# Patient Record
Sex: Male | Born: 1955
Health system: Southern US, Community
[De-identification: ages and names within clinical notes are randomized; demographics above are authoritative.]

## PROBLEM LIST (undated history)

## (undated) DIAGNOSIS — M19029 Primary osteoarthritis, unspecified elbow: Secondary | ICD-10-CM

## (undated) DIAGNOSIS — M1990 Unspecified osteoarthritis, unspecified site: Secondary | ICD-10-CM

## (undated) DIAGNOSIS — I10 Essential (primary) hypertension: Secondary | ICD-10-CM

## (undated) DIAGNOSIS — N400 Enlarged prostate without lower urinary tract symptoms: Secondary | ICD-10-CM

## (undated) HISTORY — DX: Benign prostatic hyperplasia without lower urinary tract symptoms: N40.0

## (undated) HISTORY — DX: Essential (primary) hypertension: I10

## (undated) HISTORY — DX: Unspecified osteoarthritis, unspecified site: M19.90

## (undated) HISTORY — PX: TONSILLECTOMY: SUR1361

## (undated) HISTORY — DX: Primary osteoarthritis, unspecified elbow: M19.029

---

## 2012-07-28 DIAGNOSIS — IMO0001 Reserved for inherently not codable concepts without codable children: Secondary | ICD-10-CM | POA: Insufficient documentation

## 2013-09-11 DIAGNOSIS — N400 Enlarged prostate without lower urinary tract symptoms: Secondary | ICD-10-CM | POA: Insufficient documentation

## 2015-04-21 DIAGNOSIS — I10 Essential (primary) hypertension: Secondary | ICD-10-CM | POA: Insufficient documentation

## 2017-04-05 DIAGNOSIS — M25561 Pain in right knee: Secondary | ICD-10-CM | POA: Diagnosis not present

## 2017-04-05 DIAGNOSIS — M1711 Unilateral primary osteoarthritis, right knee: Secondary | ICD-10-CM | POA: Diagnosis not present

## 2017-04-05 DIAGNOSIS — L918 Other hypertrophic disorders of the skin: Secondary | ICD-10-CM | POA: Diagnosis not present

## 2017-05-15 DIAGNOSIS — M25561 Pain in right knee: Secondary | ICD-10-CM | POA: Diagnosis not present

## 2017-05-15 DIAGNOSIS — M25461 Effusion, right knee: Secondary | ICD-10-CM | POA: Diagnosis not present

## 2017-05-15 DIAGNOSIS — M1711 Unilateral primary osteoarthritis, right knee: Secondary | ICD-10-CM | POA: Diagnosis not present

## 2017-05-15 DIAGNOSIS — M94261 Chondromalacia, right knee: Secondary | ICD-10-CM | POA: Diagnosis not present

## 2017-06-07 DIAGNOSIS — S83241A Other tear of medial meniscus, current injury, right knee, initial encounter: Secondary | ICD-10-CM | POA: Diagnosis not present

## 2017-06-07 DIAGNOSIS — M25461 Effusion, right knee: Secondary | ICD-10-CM | POA: Diagnosis not present

## 2017-06-07 DIAGNOSIS — M23221 Derangement of posterior horn of medial meniscus due to old tear or injury, right knee: Secondary | ICD-10-CM | POA: Diagnosis not present

## 2017-06-07 DIAGNOSIS — M11261 Other chondrocalcinosis, right knee: Secondary | ICD-10-CM | POA: Diagnosis not present

## 2017-06-07 DIAGNOSIS — M25361 Other instability, right knee: Secondary | ICD-10-CM | POA: Diagnosis not present

## 2017-06-07 DIAGNOSIS — M7121 Synovial cyst of popliteal space [Baker], right knee: Secondary | ICD-10-CM | POA: Diagnosis not present

## 2017-06-12 DIAGNOSIS — M94261 Chondromalacia, right knee: Secondary | ICD-10-CM | POA: Diagnosis not present

## 2017-06-12 DIAGNOSIS — S83241A Other tear of medial meniscus, current injury, right knee, initial encounter: Secondary | ICD-10-CM | POA: Insufficient documentation

## 2017-09-11 DIAGNOSIS — I10 Essential (primary) hypertension: Secondary | ICD-10-CM | POA: Diagnosis not present

## 2017-09-11 DIAGNOSIS — Z1159 Encounter for screening for other viral diseases: Secondary | ICD-10-CM | POA: Diagnosis not present

## 2017-09-12 MED FILL — LISINOPRIL 10 MG TABLET: 10 | 90 days supply | Qty: 90 | Fill #0

## 2017-09-18 DIAGNOSIS — H35711 Central serous chorioretinopathy, right eye: Secondary | ICD-10-CM | POA: Diagnosis not present

## 2017-10-09 DIAGNOSIS — H35722 Serous detachment of retinal pigment epithelium, left eye: Secondary | ICD-10-CM | POA: Diagnosis not present

## 2017-10-09 DIAGNOSIS — H35721 Serous detachment of retinal pigment epithelium, right eye: Secondary | ICD-10-CM | POA: Diagnosis not present

## 2017-10-09 DIAGNOSIS — H35371 Puckering of macula, right eye: Secondary | ICD-10-CM | POA: Diagnosis not present

## 2017-10-09 DIAGNOSIS — H43811 Vitreous degeneration, right eye: Secondary | ICD-10-CM | POA: Diagnosis not present

## 2017-11-05 DIAGNOSIS — Z23 Encounter for immunization: Secondary | ICD-10-CM | POA: Diagnosis not present

## 2017-12-10 MED FILL — LISINOPRIL 10 MG TABLET: 10 | 90 days supply | Qty: 90 | Fill #1

## 2017-12-10 MED FILL — SHIPPING COST: 1 days supply | Qty: 1 | Fill #0

## 2018-03-10 MED FILL — SHIPPING COST: 1 days supply | Qty: 1 | Fill #0

## 2018-03-10 MED FILL — LISINOPRIL 10 MG TABLET: 10 | 90 days supply | Qty: 90 | Fill #2

## 2018-05-21 MED FILL — LISINOPRIL 10 MG TABLET: 10 | 90 days supply | Qty: 90 | Fill #3

## 2018-08-19 DIAGNOSIS — Z8042 Family history of malignant neoplasm of prostate: Secondary | ICD-10-CM | POA: Diagnosis not present

## 2018-08-19 DIAGNOSIS — F5101 Primary insomnia: Secondary | ICD-10-CM | POA: Insufficient documentation

## 2018-08-19 DIAGNOSIS — I10 Essential (primary) hypertension: Secondary | ICD-10-CM | POA: Diagnosis not present

## 2018-08-19 DIAGNOSIS — Z0000 Encounter for general adult medical examination without abnormal findings: Secondary | ICD-10-CM | POA: Diagnosis not present

## 2018-08-19 MED FILL — LISINOPRIL 10 MG TABLET: 10 | 90 days supply | Qty: 90 | Fill #0

## 2018-11-11 MED FILL — LISINOPRIL 10 MG TABS: 10 | 90 days supply | Qty: 90 | Fill #1

## 2018-11-21 DIAGNOSIS — K635 Polyp of colon: Secondary | ICD-10-CM | POA: Diagnosis not present

## 2018-11-21 DIAGNOSIS — Z1211 Encounter for screening for malignant neoplasm of colon: Secondary | ICD-10-CM | POA: Diagnosis not present

## 2018-11-21 DIAGNOSIS — D122 Benign neoplasm of ascending colon: Secondary | ICD-10-CM | POA: Diagnosis not present

## 2018-11-21 LAB — HM COLONOSCOPY

## 2018-12-03 DIAGNOSIS — H43813 Vitreous degeneration, bilateral: Secondary | ICD-10-CM | POA: Diagnosis not present

## 2018-12-03 DIAGNOSIS — H35723 Serous detachment of retinal pigment epithelium, bilateral: Secondary | ICD-10-CM | POA: Diagnosis not present

## 2018-12-03 DIAGNOSIS — H35371 Puckering of macula, right eye: Secondary | ICD-10-CM | POA: Diagnosis not present

## 2019-02-09 MED FILL — LISINOPRIL 10 MG TABS: 10 | 90 days supply | Qty: 90 | Fill #0

## 2019-05-21 MED FILL — LISINOPRIL 10 MG TABS: 10 | 30 days supply | Qty: 30 | Fill #0

## 2019-06-08 DIAGNOSIS — M25521 Pain in right elbow: Secondary | ICD-10-CM | POA: Diagnosis not present

## 2019-06-08 DIAGNOSIS — M25421 Effusion, right elbow: Secondary | ICD-10-CM | POA: Diagnosis not present

## 2019-06-08 DIAGNOSIS — S52044A Nondisplaced fracture of coronoid process of right ulna, initial encounter for closed fracture: Secondary | ICD-10-CM | POA: Diagnosis not present

## 2019-06-09 DIAGNOSIS — S59901A Unspecified injury of right elbow, initial encounter: Secondary | ICD-10-CM | POA: Diagnosis not present

## 2019-07-09 MED FILL — LISINOPRIL 10 MG TABS: 10 | 30 days supply | Qty: 30 | Fill #0

## 2019-07-24 DIAGNOSIS — M25511 Pain in right shoulder: Secondary | ICD-10-CM | POA: Diagnosis not present

## 2019-07-24 DIAGNOSIS — M549 Dorsalgia, unspecified: Secondary | ICD-10-CM | POA: Diagnosis not present

## 2019-08-04 MED FILL — LISINOPRIL 10 MG TABS: 10 | 30 days supply | Qty: 30 | Fill #1

## 2019-08-19 DIAGNOSIS — M93221 Osteochondritis dissecans, right elbow: Secondary | ICD-10-CM | POA: Diagnosis not present

## 2019-08-19 DIAGNOSIS — M25521 Pain in right elbow: Secondary | ICD-10-CM | POA: Diagnosis not present

## 2019-08-25 ENCOUNTER — Other Ambulatory Visit: Payer: Self-pay | Admitting: Student

## 2019-08-25 DIAGNOSIS — M93221 Osteochondritis dissecans, right elbow: Secondary | ICD-10-CM

## 2019-09-01 MED FILL — LISINOPRIL 10 MG TABS: 10 | 30 days supply | Qty: 30 | Fill #0

## 2019-09-15 ENCOUNTER — Other Ambulatory Visit: Payer: Self-pay

## 2019-09-15 ENCOUNTER — Ambulatory Visit
Admission: RE | Admit: 2019-09-15 | Discharge: 2019-09-15 | Disposition: A | Discharge status: Home / Self Care | Payer: 59 | Source: Ambulatory Visit | Attending: Student | Admitting: Student

## 2019-09-15 DIAGNOSIS — M93221 Osteochondritis dissecans, right elbow: Secondary | ICD-10-CM

## 2019-09-15 DIAGNOSIS — M19021 Primary osteoarthritis, right elbow: Secondary | ICD-10-CM | POA: Diagnosis not present

## 2019-09-25 MED FILL — LISINOPRIL 10 MG TABS: 10 | 30 days supply | Qty: 30 | Fill #1

## 2019-10-22 ENCOUNTER — Telehealth: Payer: Self-pay

## 2019-10-22 ENCOUNTER — Encounter: Payer: Self-pay | Admitting: Sports Medicine

## 2019-10-22 ENCOUNTER — Ambulatory Visit (INDEPENDENT_AMBULATORY_CARE_PROVIDER_SITE_OTHER): Payer: 59 | Admitting: Sports Medicine

## 2019-10-22 DIAGNOSIS — M19021 Primary osteoarthritis, right elbow: Secondary | ICD-10-CM | POA: Insufficient documentation

## 2019-10-22 DIAGNOSIS — M6788 Other specified disorders of synovium and tendon, other site: Secondary | ICD-10-CM

## 2019-10-22 MED ORDER — NITROGLYCERIN 0.2 MG/HR TD PT24: MEDICATED_PATCH | TRANSDERMAL | 11 refills | Status: DC

## 2019-10-22 NOTE — Progress Notes (Signed)
    Procedures performed today:    None.  Independent interpretation of notes and tests performed by another provider:   MRI personally reviewed, there is osteoarthritis, mild effusion as well as intra-articular loose body in the olecranon fossa.  Brief History, Exam, Impression, and Recommendations:    Achilles tendinosis of left lower extremity John Tran has had a year of pain in his left Achilles, he has a palpable nodule, all consistent with classic Achilles tendinosis. We described the pathophysiology as well as the treatment plan, we will do heel lifts, he will cross train from running, adding eccentric physical therapy, topical nitroglycerin patches. He understands this can take months to heal. Return to see me in 4 weeks.  Primary osteoarthritis of right elbow John Tran also had a fall back in May, since then he has had moderate elbow pain, he ultimately had an MRI of his elbow performed that showed osteoarthritis as well as an intra-articular loose body in the olecranon fossa. His principal complaint is lack of range of motion, for this reason I do think it is crucial to remove the loose body. Referral to orthopedic surgery, when he returns to me afterwards we can certainly try a steroid injection with ultrasound guidance if needed.    ___________________________________________ Gwen Her. Dianah Field, M.D., ABFM., CAQSM. Primary Care and [place] Instructor of [employer] [facility]

## 2019-10-22 NOTE — Telephone Encounter (Signed)
Here is the clarification needed, just tell them he will need to cut each patch into quarters and then place one quarter patch on the site of the maximum pain daily, leave it on for 24 hours, remove it the next day and place a different quarter patch.  He will do this daily for 4 to 8 weeks.  It is okay to cut the patches.  There will be no abnormal release of the medication.  Please relay this information to the pharmacy.

## 2019-10-22 NOTE — Assessment & Plan Note (Signed)
John Tran has had a year of pain in his left Achilles, he has a palpable nodule, all consistent with classic Achilles tendinosis. We described the pathophysiology as well as the treatment plan, we will do heel lifts, he will cross train from running, adding eccentric physical therapy, topical nitroglycerin patches. He understands this can take months to heal. Return to see me in 4 weeks.

## 2019-10-22 NOTE — Telephone Encounter (Signed)
[facility] requesting clarification and directions for nitroglycerin patches. Please contact the pharmacy at [phone]. May ask to speak with pharmacist Juliann Pulse or Elmo Putt.

## 2019-10-22 NOTE — Assessment & Plan Note (Signed)
John Tran also had a fall back in May, since then he has had moderate elbow pain, he ultimately had an MRI of his elbow performed that showed osteoarthritis as well as an intra-articular loose body in the olecranon fossa. His principal complaint is lack of range of motion, for this reason I do think it is crucial to remove the loose body. Referral to orthopedic surgery, when he returns to me afterwards we can certainly try a steroid injection with ultrasound guidance if needed.

## 2019-10-23 NOTE — Telephone Encounter (Signed)
Called pharmacy, they will not accept this because pharmacist states due to manufacturer it is not OK to cut patch and they will not be able to dispense an RX with that sig

## 2019-10-23 NOTE — Telephone Encounter (Signed)
Opened in error

## 2019-10-24 MED ORDER — NITROGLYCERIN 0.2 MG/HR TD PT24: MEDICATED_PATCH | TRANSDERMAL | 11 refills | Status: DC

## 2019-10-24 NOTE — Telephone Encounter (Signed)
This is ridiculous, I resent the prescription and removed the instructions to cut the patch but please let the patient know he needs to cut it into quarters.  The other option is we can just send it to a different pharmacy where the pharmacist will not question the authority of the physician.

## 2019-10-26 MED ORDER — NITROGLYCERIN 0.2 MG/HR TD PT24: MEDICATED_PATCH | TRANSDERMAL | 11 refills | Status: DC

## 2019-10-26 NOTE — Telephone Encounter (Signed)
The prescription still reads Cut and apply patch to most painful area q24h.

## 2019-10-26 NOTE — Telephone Encounter (Signed)
Well sh*t, let me try again.

## 2019-10-28 ENCOUNTER — Ambulatory Visit: Payer: 59 | Admitting: Family Medicine

## 2019-10-30 ENCOUNTER — Ambulatory Visit: Payer: Self-pay

## 2019-10-30 ENCOUNTER — Ambulatory Visit: Payer: 59 | Admitting: Orthopaedic Surgery

## 2019-10-30 VITALS — Ht 69.5 in | Wt 192.0 lb

## 2019-10-30 DIAGNOSIS — M19021 Primary osteoarthritis, right elbow: Secondary | ICD-10-CM | POA: Diagnosis not present

## 2019-10-30 NOTE — Progress Notes (Signed)
Office Visit Note   Patient: John Tran           Date of Birth: Feb 08, 1955           MRN: [id number] Visit Date: 10/30/2019              Requested by: John Tran, [address] [place],  [place] [id number] PCP: John Baltimore, MD   Assessment & Plan: Visit Diagnoses:  1. Primary osteoarthritis of right elbow     Plan: Impression is right elbow OA.  He does not really complain of any mechanical symptoms but mainly stiffness and some mild pain.  I reviewed the MRI with the patient in detail and we agreed to try cortisone injection using ultrasound guidance today see how this improves his symptoms.  We also briefly discussed surgical treatment of loose body excision.  He will get back in touch with [place] if he is interested in the surgery should this injection failed to provide him with any relief.  Follow-Up Instructions: Return if symptoms worsen or fail to improve.   Orders:  No orders of the defined types were placed in this encounter.  No orders of the defined types were placed in this encounter.     Procedures: No procedures performed   Clinical Data: No additional findings.   Subjective: Chief Complaint  Patient presents with  . Right Elbow - Pain    John Tran is a very pleasant 64 year old gentleman who works in [place] who is a referral from [facility] for right elbow stiffness and OA.  He is right-handed and states that the pain is mild that is worse with activity.  He has noticed that he does not have full range of motion.  Denies any numbness and tingling.  He feels that this has gotten worse after he had a fall while hiking in May.  He has taken some over-the-counter ibuprofen as needed.  Recently had MRI which showed OA and loose bodies in the olecranon fossa.   Review of Systems  Constitutional: Negative.   All other systems reviewed and are negative.    Objective: Vital Signs: Ht 5' 9.5" (1.765 m)   Wt 192 lb  (87.1 kg)   BMI 27.95 kg/m   Physical Exam Vitals and nursing note reviewed.  Constitutional:      Appearance: He is well-developed.  HENT:     Head: Normocephalic and atraumatic.  Eyes:     Pupils: Pupils are equal, round, and reactive to light.  Pulmonary:     Effort: Pulmonary effort is normal.  Abdominal:     Palpations: Abdomen is soft.  Musculoskeletal:        General: Normal range of motion.     Cervical back: Neck supple.  Skin:    General: Skin is warm.  Neurological:     Mental Status: He is alert and oriented to person, place, and time.  Psychiatric:        Behavior: Behavior normal.        Thought Content: Thought content normal.        Judgment: Judgment normal.     Ortho Exam Right elbow shows limited flexion to 90 degrees and extension to about 15 degrees.  Normal pronation supination.  No neurovascular compromise. Specialty Comments:  No specialty comments available.  Imaging: No results found.   PMFS History: Patient Active Problem List   Diagnosis Date Noted  . Primary osteoarthritis of right elbow 10/22/2019  .  Achilles tendinosis of left lower extremity 10/22/2019   Past Medical History:  Diagnosis Date  . Arthritis   . Hypertension     No family history on file.   Social History   Occupational History  . Not on file  Tobacco Use  . Smoking status: Never Smoker  . Smokeless tobacco: Never Used  Substance and Sexual Activity  . Alcohol use: Yes    Alcohol/week: 3.0 standard drinks    Types: 3 Glasses of wine per week  . Drug use: Not on file  . Sexual activity: Not on file

## 2019-10-30 NOTE — Addendum Note (Signed)
Addended by: Robyne Peers on: 10/30/2019 10:06 AM   Modules accepted: Orders

## 2019-10-30 NOTE — Progress Notes (Signed)
Subjective: He is here for ultrasound-guided right elbow injection.  Objective: He has stiffness in the elbow with extension and flexion.  Procedure: Ultrasound-guided right elbow intra-articular injection: After sterile prep with Betadine, injected 3 cc 1% lidocaine without epinephrine and 40 mg methylprednisolone into the posterior joint recess.  He had good relief during the anesthetic phase.

## 2019-11-02 ENCOUNTER — Ambulatory Visit: Payer: 59 | Admitting: Rehabilitative and Restorative Service Providers"

## 2019-11-05 ENCOUNTER — Encounter: Payer: Self-pay | Admitting: Family Medicine

## 2019-11-05 ENCOUNTER — Ambulatory Visit (INDEPENDENT_AMBULATORY_CARE_PROVIDER_SITE_OTHER): Payer: 59 | Admitting: Family Medicine

## 2019-11-05 ENCOUNTER — Ambulatory Visit (INDEPENDENT_AMBULATORY_CARE_PROVIDER_SITE_OTHER): Payer: 59 | Admitting: Rehabilitative and Restorative Service Providers"

## 2019-11-05 ENCOUNTER — Encounter: Payer: Self-pay | Admitting: Rehabilitative and Restorative Service Providers"

## 2019-11-05 ENCOUNTER — Other Ambulatory Visit: Payer: Self-pay

## 2019-11-05 VITALS — BP 148/80 | HR 63 | Temp 98.3°F | Ht 69.29 in | Wt 191.1 lb

## 2019-11-05 DIAGNOSIS — I10 Essential (primary) hypertension: Secondary | ICD-10-CM | POA: Diagnosis not present

## 2019-11-05 DIAGNOSIS — M6281 Muscle weakness (generalized): Secondary | ICD-10-CM | POA: Diagnosis not present

## 2019-11-05 DIAGNOSIS — M25572 Pain in left ankle and joints of left foot: Secondary | ICD-10-CM | POA: Diagnosis not present

## 2019-11-05 DIAGNOSIS — Z23 Encounter for immunization: Secondary | ICD-10-CM | POA: Diagnosis not present

## 2019-11-05 NOTE — Assessment & Plan Note (Signed)
BP is mildly elevated.  He has had white coat HTN previously.  He will bring home cuff in to next appt to compare as readings at home have been well controlled.  He will continue lisinopril at current strength for now.

## 2019-11-05 NOTE — Progress Notes (Signed)
John Tran - 64 y.o. male MRN [id number]  Date of birth: 03-12-1955  Subjective Chief Complaint  Patient presents with  . Establish Care    HPI John Tran is a 64 y.o. male here today for initial visit.  He has a history of HTN and BPH.  He has had some orthopedic issues with his elbow and achilles.  He is currently seeing orthopedics for his elbow.  He is tolerating lisinopril well for management of his HTN.  HE denies symptoms related to HTN including chest pain, shortness of breath, palpitations, headache or vision changes.   ROS:  A comprehensive ROS was completed and negative except as noted per HPI  No Known Allergies  Past Medical History:  Diagnosis Date  . Arthritis   . Hypertension     Past Surgical History:  Procedure Laterality Date  . TONSILLECTOMY      Social History   Socioeconomic History  . Marital status: Married    Spouse name: Not on file  . Number of children: 3  . Years of education: Not on file  . Highest education level: Not on file  Occupational History  . Not on file  Tobacco Use  . Smoking status: Never Smoker  . Smokeless tobacco: Never Used  Vaping Use  . Vaping Use: Never used  Substance and Sexual Activity  . Alcohol use: Yes    Alcohol/week: 4.0 standard drinks    Types: 4 Glasses of wine per week  . Drug use: Never  . Sexual activity: Yes    Partners: Female  Other Topics Concern  . Not on file  Social History Narrative  . Not on file   Social Determinants of Health   Financial Resource Strain:   . Difficulty of Paying Living Expenses: Not on file  Food Insecurity:   . Worried About Charity fundraiser in the Last Year: Not on file  . Ran Out of Food in the Last Year: Not on file  Transportation Needs:   . Lack of Transportation (Medical): Not on file  . Lack of Transportation (Non-Medical): Not on file  Physical Activity:   . Days of Exercise per Week: Not on file  . Minutes of Exercise per Session: Not on file   Stress:   . Feeling of Stress : Not on file  Social Connections:   . Frequency of Communication with Friends and Family: Not on file  . Frequency of Social Gatherings with Friends and Family: Not on file  . Attends Religious Services: Not on file  . Active Member of Clubs or Organizations: Not on file  . Attends Archivist Meetings: Not on file  . Marital Status: Not on file    Family History  Problem Relation Age of Onset  . Hypertension Mother   . Skin cancer Mother   . Prostate cancer Father     Health Maintenance  Topic Date Due  . Hepatitis C Screening  Never done  . COVID-19 Vaccine (1) Never done  . HIV Screening  Never done  . COLONOSCOPY  Never done  . TETANUS/TDAP  09/12/2023  . INFLUENZA VACCINE  Completed     ----------------------------------------------------------------------------------------------------------------------------------------------------------------------------------------------------------------- Physical Exam BP (!) 148/80 (BP Location: Left Arm, Patient Position: Sitting, Cuff Size: Normal)   Pulse 63   Temp 98.3 F (36.8 C) (Oral)   Ht 5' 9.29" (1.76 m)   Wt 191 lb 1.6 oz (86.7 kg)   SpO2 96%   BMI 27.98 kg/m  Physical Exam Constitutional:      Appearance: Normal appearance.  Eyes:     General: No scleral icterus. Cardiovascular:     Rate and Rhythm: Normal rate and regular rhythm.  Pulmonary:     Effort: Pulmonary effort is normal.     Breath sounds: Normal breath sounds.  Skin:    General: Skin is warm and dry.  Neurological:     General: No focal deficit present.     Mental Status: He is alert.  Psychiatric:        Mood and Affect: Mood normal.        Behavior: Behavior normal.     ------------------------------------------------------------------------------------------------------------------------------------------------------------------------------------------------------------------- Assessment and  Plan  Benign essential hypertension BP is mildly elevated.  He has had white coat HTN previously.  He will bring home cuff in to next appt to compare as readings at home have been well controlled.  He will continue lisinopril at current strength for now.     No orders of the defined types were placed in this encounter.   No follow-ups on file.    This visit occurred during the SARS-CoV-2 public health emergency.  Safety protocols were in place, including screening questions prior to the visit, additional usage of staff PPE, and extensive cleaning of exam room while observing appropriate contact time as indicated for disinfecting solutions.

## 2019-11-05 NOTE — Patient Instructions (Signed)
Access Code: [email or web address] URL: [email or web address] Date: 11/05/2019 Prepared by: Rudell Cobb  Exercises Gastroc Stretch on Wall - 2 x daily - 7 x weekly - 1 sets - 3 reps - 30 seconds hold Standing Soleus Stretch - 2 x daily - 7 x weekly - 1 sets - 3 reps - 30 seconds hold Calf Mobilization with Foam Roll - 2 x daily - 7 x weekly - 1 sets - 1 reps - 3 minutes hold Squat with Chair Touch - 2 x daily - 7 x weekly - 1 sets - 10 reps Seated Table Hamstring Stretch - 2 x daily - 7 x weekly - 1 sets - 3 reps - 30 seconds hold

## 2019-11-05 NOTE — Patient Instructions (Addendum)
Great to meet you today! Continue lisinopril at current dose.  See me again around the first of the year for annual exam.

## 2019-11-05 NOTE — Therapy (Signed)
[facility] [address] [place], [place], [address] Phone: [phone]   Fax:  [phone]  Physical Therapy Evaluation  Patient Details  Name: John Tran MRN: [id number] Date of Birth: 07-Jan-1956 Referring Provider (PT): Aundria Mems, MD   Encounter Date: 11/05/2019   PT End of Session - 11/05/19 1529    Visit Number 1    Number of Visits 12    Date for PT Re-Evaluation 11/17/19    Authorization Type UMR/ [facility]ac[facility] Pontes    PT Start Time 1448    PT Stop Time 1528    PT Time Calculation (min) 40 min    Activity Tolerance Patient tolerated treatment well;Patient limited by pain    Behavior During Therapy [facility] for tasks assessed/performed           Past Medical History:  Diagnosis Date   Arthritis    Hypertension     Past Surgical History:  Procedure Laterality Date   TONSILLECTOMY      There were no vitals filed for this visit.    Subjective Assessment - 11/05/19 1447    Subjective The patient reports that he is feeling better with heel lifts in the shoes and also feels like the patches are helping.  Initial onset of pain was a year ago.  He reports he limps when pain is bad.  It can feel stiff in the morning.    How long can you walk comfortably? Walks 2 miles at a time with his wife; is avoiding running    Patient Stated Goals Being able to walk without pain    Currently in Pain? Yes    Pain Score 0-No pain   "I limp when I'm walking"  goes up to moderate level   Pain Location Ankle    Pain Orientation Left    Pain Type Chronic pain    Pain Onset More than a month ago    Pain Frequency Intermittent              [facility] PT Assessment - 11/05/19 1456      Assessment   Medical Diagnosis L achilles tendonosis    Referring Provider (PT) Aundria Mems, MD    Onset Date/Surgical Date 10/22/19   one year onset   Hand Dominance Right    Prior Therapy none      Precautions   Precautions  None      Restrictions   Weight Bearing Restrictions No      Balance Screen   Has the patient fallen in the past 6 months No    Has the patient had a decrease in activity level because of a fear of falling?  No    Is the patient reluctant to leave their home because of a fear of falling?  No      Home Ecologist residence    Living Arrangements Spouse/significant other      Prior Function   Level of Independence Independent    Vocation Full time employment    Vocation Requirements [employer]/ works from home      Observation/Other Assessments   Focus on Therapeutic Outcomes (FOTO)  24% limitation      Sensation   Light Touch Appears Intact      ROM / Strength   AROM / PROM / Strength AROM;Strength      AROM   Overall AROM  Deficits    AROM Assessment Site Ankle    Right/Left  Ankle Right;Left    Right Ankle Dorsiflexion -4    Right Ankle Plantar Flexion 44    Left Ankle Dorsiflexion -12    Left Ankle Plantar Flexion 48    Left Ankle Inversion 19    Left Ankle Eversion 15      Strength   Overall Strength Deficits    Strength Assessment Site Ankle    Right/Left Ankle Left    Left Ankle Dorsiflexion 5/5    Left Ankle Plantar Flexion 3/5   able to lift against gravity 1 rep with pain   Left Ankle Inversion 5/5    Left Ankle Eversion 5/5      Flexibility   Soft Tissue Assessment /Muscle Length yes    Hamstrings bilateral significant hamstring tightness and significant bilateral gastroc tightness      Palpation   Palpation comment Tenderness to palpation L gastrocnemius and L soleous                      Objective measurements completed on examination: See above findings.       [facility] Adult PT Treatment/Exercise - 11/05/19 1756      Self-Care   Self-Care Other Self-Care Comments    Other Self-Care Comments  gastroc soft tissue mobilization with rolling pin or tennis ball      Exercises   Exercises Ankle;Knee/Hip        Knee/Hip Exercises: Stretches   Active Hamstring Stretch Right;Left;1 rep;30 seconds      Knee/Hip Exercises: Standing   Heel Raises Left    Heel Raises Limitations pain; not able to tolerate    Abduction Limitations sidestepping with dec'd push off noted through L LE when moving to the R    Functional Squat 10 reps    Functional Squat Limitations 1/4 to 1/2 squat near chair for technique      Ankle Exercises: Stretches   Soleus Stretch 1 rep;30 seconds    Gastroc Stretch 1 rep;30 seconds                  PT Education - 11/05/19 1528    Education Details HEP    Person(s) Educated Patient    Methods Explanation;Demonstration;Handout    Comprehension Verbalized understanding;Returned demonstration               PT Long Term Goals - 11/05/19 1531      PT LONG TERM GOAL #1   Title The patient will be indep with HEP.    Time 6    Period Weeks    Target Date 12/17/19      PT LONG TERM GOAL #2   Title The patient will improve functional limitation per FOTO from 25% to 18% limitation.    Time 6    Period Weeks    Target Date 12/17/19      PT LONG TERM GOAL #3   Title The patient will demonstrate a L LE heel raise without pain.    Time 6    Period Weeks    Target Date 12/17/19      PT LONG TERM GOAL #4   Title The patient will improve ankle DF to -5 degrees (from -12 degrees)    Time 6    Period Weeks    Target Date 12/17/19      PT LONG TERM GOAL #5   Title The patient will demonstrate a deep squat without achilles pain.    Time 6    Period Weeks  Target Date 12/17/19                  Plan - 11/05/19 1758    Clinical Impression Statement The patient is a 64 yo male presenting to OP physical therapy with L achilles tendonosis.  He presents with impairments in L ankle AROM, myofascial tightness, pain, weakness plantarflexion limiting participation in recreational activities.  PT to address deficits to return to prior functional status.     Personal Factors and Comorbidities Time since onset of injury/illness/exacerbation    Examination-Activity Limitations Locomotion Level;Squat;Stairs;Stand    Examination-Participation Restrictions Community Activity    Stability/Clinical Decision Making Stable/Uncomplicated    Clinical Decision Making Low    Rehab Potential Good    PT Frequency 2x / week    PT Duration 6 weeks    PT Treatment/Interventions ADLs/Self Care Home Management;Dry needling;Taping;Iontophoresis 4mg /ml Dexamethasone;Cryotherapy;Advice worker;Therapeutic exercise;Therapeutic activities;Neuromuscular re-education;Balance training;Manual techniques;Patient/family education;Orthotic Fit/Training;Moist Heat    PT Next Visit Plan progress HEP focusing on mobilization of the ankle, eccentric loading to tolerance and flexibility    Consulted and Agree with Plan of Care Patient           Patient will benefit from skilled therapeutic intervention in order to improve the following deficits and impairments:  Pain, Hypomobility, Impaired flexibility, Decreased strength, Decreased range of motion, Increased fascial restricitons  Visit Diagnosis: Pain in left ankle and joints of left foot  Muscle weakness (generalized)     Problem List Patient Active Problem List   Diagnosis Date Noted   Primary osteoarthritis of right elbow 10/22/2019   Achilles tendinosis of left lower extremity 10/22/2019   Primary insomnia 08/19/2018   Tear of medial meniscus of right knee, current 06/12/2017   Benign essential hypertension 04/21/2015   BPH (benign prostatic hyperplasia) 09/11/2013   Elevated BP 07/28/2012    Carles Florea , PT 11/05/2019, 6:05 PM  [facility] [address] [place], [place], [address] Phone: [phone]   Fax:  [phone]  Name: John Tran MRN: [id number] Date of Birth: Jun 15, 1955

## 2019-11-09 ENCOUNTER — Other Ambulatory Visit (HOSPITAL_COMMUNITY): Payer: Self-pay | Admitting: Family Medicine

## 2019-11-09 ENCOUNTER — Ambulatory Visit: Payer: 59 | Admitting: Physical Therapy

## 2019-11-09 MED FILL — LISINOPRIL 10 MG TABS: 10 | 30 days supply | Qty: 30 | Fill #0

## 2019-11-12 ENCOUNTER — Encounter: Payer: 59 | Admitting: Physical Therapy

## 2019-11-16 ENCOUNTER — Other Ambulatory Visit: Payer: Self-pay

## 2019-11-16 ENCOUNTER — Ambulatory Visit (INDEPENDENT_AMBULATORY_CARE_PROVIDER_SITE_OTHER): Payer: 59 | Admitting: Rehabilitative and Restorative Service Providers"

## 2019-11-16 DIAGNOSIS — M6281 Muscle weakness (generalized): Secondary | ICD-10-CM

## 2019-11-16 DIAGNOSIS — M25572 Pain in left ankle and joints of left foot: Secondary | ICD-10-CM

## 2019-11-16 NOTE — Therapy (Signed)
[place], [place], [address] Phone: [phone]   Fax:  [phone]  Physical Therapy Treatment  Patient Details  Name: GREGREY BLOYD MRN: [id number] Date of Birth: 1955-05-15 Referring Provider (PT): Aundria Mems, MD   Encounter Date: 11/16/2019   PT End of Session - 11/16/19 0718    Visit Number 2    Number of Visits 12    Date for PT Re-Evaluation 11/17/19    Authorization Type UMR/ [employer]    PT Start Time 7510    PT Stop Time 0755    PT Time Calculation (min) 42 min    Activity Tolerance Patient tolerated treatment well;Patient limited by pain    Behavior During Therapy [facility] for tasks assessed/performed           Past Medical History:  Diagnosis Date  . Arthritis   . Hypertension     Past Surgical History:  Procedure Laterality Date  . TONSILLECTOMY      There were no vitals filed for this visit.   Subjective Assessment - 11/16/19 0713    Subjective The patient reports he has been stretching and notices less pain and more flexibility.  "It's been a lot better."    How long can you walk comfortably? Walks 2 miles at a time with his wife; is avoiding running    Patient Stated Goals Being able to walk without pain    Currently in Pain? Yes    Pain Score 2     Pain Location Ankle    Pain Orientation Left    Pain Descriptors / Indicators Tightness;Aching    Pain Type Chronic pain    Pain Onset More than a month ago    Pain Frequency Intermittent    Aggravating Factors  rated while warming up on treadmill @ 3% incline    Pain Relieving Factors stretching              [facility] PT Assessment - 11/16/19 0726      Assessment   Medical Diagnosis L achilles tendonosis    Referring Provider (PT) Aundria Mems, MD    Onset Date/Surgical Date 10/22/19    Hand Dominance Right                         [facility] Adult PT Treatment/Exercise - 11/16/19 0726       Exercises   Exercises Ankle;Knee/Hip      Knee/Hip Exercises: Standing   Forward Lunges Right;Left;10 reps    Forward Lunges Limitations split lunges     Hip Abduction Stengthening;2 sets;10 reps    Abduction Limitations through standing sidestepping; then added resistance with green band x 10 reps x 2 sets    Lateral Step Up Right;Left;15 reps    Lateral Step Up Limitations 4" step without UE support      Manual Therapy   Manual Therapy Soft tissue mobilization    Manual therapy comments to reduce myofascial tightness    Soft tissue mobilization L soleous and gastroc STM and IASTM      Ankle Exercises: Stretches   Slant Board Stretch 1 rep;60 seconds      Ankle Exercises: Standing   SLS On theradisc with intermittent UE support; then solid surface with difficulty maintaining x 10 seconds without UE support    Heel Raises Both;10 reps    Heel Raises Limitations with some pain noted; modified to be dropped off edge of 4"  step and then lift ot neutral                       PT Long Term Goals - 11/05/19 1531      PT LONG TERM GOAL #1   Title The patient will be indep with HEP.    Time 6    Period Weeks    Target Date 12/17/19      PT LONG TERM GOAL #2   Title The patient will improve functional limitation per FOTO from 25% to 18% limitation.    Time 6    Period Weeks    Target Date 12/17/19      PT LONG TERM GOAL #3   Title The patient will demonstrate a L LE heel raise without pain.    Time 6    Period Weeks    Target Date 12/17/19      PT LONG TERM GOAL #4   Title The patient will improve ankle DF to -5 degrees (from -12 degrees)    Time 6    Period Weeks    Target Date 12/17/19      PT LONG TERM GOAL #5   Title The patient will demonstrate a deep squat without achilles pain.    Time 6    Period Weeks    Target Date 12/17/19                 Plan - 11/16/19 0725    Clinical Impression Statement The patient has improved flexibility.   With ther ex, note instability lateral hips with difficulty with single leg activities.  PT to progress to patient tolerance.    Rehab Potential Good    PT Frequency 2x / week    PT Duration 6 weeks    PT Treatment/Interventions ADLs/Self Care Home Management;Dry needling;Taping;Iontophoresis 4mg /ml Dexamethasone;Cryotherapy;Advice worker;Therapeutic exercise;Therapeutic activities;Neuromuscular re-education;Balance training;Manual techniques;Patient/family education;Orthotic Fit/Training;Moist Heat    PT Next Visit Plan progress HEP focusing on mobilization of the ankle, eccentric loading to tolerance and flexibility    Consulted and Agree with Plan of Care Patient           Patient will benefit from skilled therapeutic intervention in order to improve the following deficits and impairments:  Pain, Hypomobility, Impaired flexibility, Decreased strength, Decreased range of motion, Increased fascial restricitons  Visit Diagnosis: Pain in left ankle and joints of left foot  Muscle weakness (generalized)     Problem List Patient Active Problem List   Diagnosis Date Noted  . Primary osteoarthritis of right elbow 10/22/2019  . Achilles tendinosis of left lower extremity 10/22/2019  . Primary insomnia 08/19/2018  . Tear of medial meniscus of right knee, current 06/12/2017  . Benign essential hypertension 04/21/2015  . BPH (benign prostatic hyperplasia) 09/11/2013  . Elevated BP 07/28/2012    Aasir Daigler, PT 11/16/2019, 7:58 AM  [facility] [place], [place], [address] Phone: [phone]   Fax:  [phone]  Name: BOOKERT GUZZI MRN: [id number] Date of Birth: [birth year]

## 2019-11-18 ENCOUNTER — Ambulatory Visit: Payer: 59 | Admitting: Sports Medicine

## 2019-11-19 ENCOUNTER — Ambulatory Visit: Payer: 59 | Admitting: Sports Medicine

## 2019-11-24 ENCOUNTER — Encounter: Payer: Self-pay | Admitting: Family Medicine

## 2019-11-25 ENCOUNTER — Encounter: Payer: 59 | Admitting: Rehabilitative and Restorative Service Providers"

## 2019-12-02 ENCOUNTER — Other Ambulatory Visit: Payer: Self-pay

## 2019-12-02 ENCOUNTER — Ambulatory Visit (INDEPENDENT_AMBULATORY_CARE_PROVIDER_SITE_OTHER): Payer: 59 | Admitting: Rehabilitative and Restorative Service Providers"

## 2019-12-02 ENCOUNTER — Encounter: Payer: Self-pay | Admitting: Rehabilitative and Restorative Service Providers"

## 2019-12-02 DIAGNOSIS — M6281 Muscle weakness (generalized): Secondary | ICD-10-CM | POA: Diagnosis not present

## 2019-12-02 DIAGNOSIS — M25572 Pain in left ankle and joints of left foot: Secondary | ICD-10-CM | POA: Diagnosis not present

## 2019-12-02 NOTE — Therapy (Addendum)
[place], [place], [address] Phone: [phone]   Fax:  [phone]   Physical Therapy Treatment and Discharge Summary  Patient Details  Name: John Tran MRN: [id number] Date of Birth: 1955-06-29 Referring Provider (PT): Aundria Mems, MD    PHYSICAL THERAPY DISCHARGE SUMMARY  Visits from Start of Care: 3  Current functional level related to goals / functional outcomes: See below-- did not return.   Remaining deficits: See below for last known status-- patient did not return.   Education / Equipment: HEP Plan: Patient agrees to discharge.  Patient goals were partially met. Patient is being discharged due to being pleased with the current functional level.  ?????         Encounter Date: 12/02/2019   PT End of Session - 12/02/19 0803    Visit Number 3    Number of Visits 12    Date for PT Re-Evaluation 11/17/19    Authorization Type UMR/ [employer]    PT Start Time 0804    PT Stop Time 0845    PT Time Calculation (min) 41 min    Activity Tolerance Patient tolerated treatment well;Patient limited by pain    Behavior During Therapy [facility] for tasks assessed/performed           Past Medical History:  Diagnosis Date  . Arthritis   . Hypertension     Past Surgical History:  Procedure Laterality Date  . TONSILLECTOMY      There were no vitals filed for this visit.   Subjective Assessment - 12/02/19 0805    Subjective The patient did not do as much HEP last week b/c he was travelling-- [place] noticeable pain.    How long can you walk comfortably? Walks 2 miles at a time with his wife; is avoiding running    Patient Stated Goals Being able to walk without pain    Currently in Pain? No/denies              [facility] PT Assessment - 12/02/19 8315      Assessment   Medical Diagnosis L achilles tendonosis    Referring Provider (PT)  Aundria Mems, MD    Onset Date/Surgical Date 10/22/19    Hand Dominance Right                         [facility] Adult PT Treatment/Exercise - 12/02/19 0808      Self-Care   Self-Care Other Self-Care Comments    Other Self-Care Comments  discussed pronator support shoewear and provided information for shoes-- recommended continue with current inserts per MD      Exercises   Exercises Ankle;Knee/Hip      Knee/Hip Exercises: Standing   Lateral Step Up Right;Left;15 reps    Lateral Step Up Limitations 8" step      Ankle Exercises: Stretches   Slant Board Stretch 1 rep;60 seconds    Slant Board Stretch Limitations bilaterally      Ankle Exercises: Aerobic   Elliptical 3 minuutes       Ankle Exercises: Standing   Vector Stance 5 reps;Left    Vector Stance Limitations with cone tapping in multi-planar direction without shoes donned (has significant pronation)    SLS On theradisc initially working on stability on compliant surface--- too challenging-- returned to static stance on solid surface without shoe working on stability    Heel Raises  Both;10 reps    Heel Raises Limitations still notes mild pain in L achilles with heel raise on solid surface; performed stretching, then returned to heel raise and could tolerate without pain x 10 reps    Other Standing Ankle Exercises deep squats without pain      Ankle Exercises: Seated   Other Seated Ankle Exercises arch lifts x 10 reps; seated great toe extension                  PT Education - 12/02/19 0833    Education Details HEP    Person(s) Educated Patient    Methods Explanation;Demonstration;Handout    Comprehension Verbalized understanding;Returned demonstration               PT Long Term Goals - 12/02/19 0807      PT LONG TERM GOAL #1   Title The patient will be indep with HEP.    Time 6    Period Weeks      PT LONG TERM GOAL #2   Title The patient will improve functional limitation per  FOTO from 25% to 18% limitation.    Time 6    Period Weeks      PT LONG TERM GOAL #3   Title The patient will demonstrate a L LE heel raise without pain.    Time 6    Period Weeks    Status Partially Met      PT LONG TERM GOAL #4   Title The patient will improve ankle DF to -5 degrees (from -12 degrees)    Baseline Patient has 3 degrees of ankle DF    Time 6    Period Weeks    Status Achieved      PT LONG TERM GOAL #5   Title The patient will demonstrate a deep squat without achilles pain.    Time 6    Period Weeks    Status Achieved                 Plan - 12/02/19 0955    Clinical Impression Statement The patient is continuing with home exercises with less pain with walking.  He has met LTG for ankle A/ROM and for deep squat tolerance.  He continues with pain with heel raise that improves after stretching.  He also notes pain in medial ankle with static unilateral standing activities.    Rehab Potential Good    PT Frequency 2x / week    PT Duration 6 weeks    PT Treatment/Interventions ADLs/Self Care Home Management;Dry needling;Taping;Iontophoresis 46m/ml Dexamethasone;Cryotherapy;EAdvice workerTherapeutic exercise;Therapeutic activities;Neuromuscular re-education;Balance training;Manual techniques;Patient/family education;Orthotic Fit/Training;Moist Heat    PT Next Visit Plan progress HEP focusing on mobilization of the ankle, eccentric loading to tolerance and flexibility    Consulted and Agree with Plan of Care Patient           Patient will benefit from skilled therapeutic intervention in order to improve the following deficits and impairments:  Pain, Hypomobility, Impaired flexibility, Decreased strength, Decreased range of motion, Increased fascial restricitons  Visit Diagnosis: Pain in left ankle and joints of left foot  Muscle weakness (generalized)     Problem List Patient Active Problem List   Diagnosis Date Noted  . Primary  osteoarthritis of right elbow 10/22/2019  . Achilles tendinosis of left lower extremity 10/22/2019  . Primary insomnia 08/19/2018  . Tear of medial meniscus of right knee, current 06/12/2017  . Benign essential hypertension 04/21/2015  . BPH (benign prostatic  hyperplasia) 09/11/2013  . Elevated BP 07/28/2012    Satia Winger, PT 12/02/2019, 9:57 AM  [facility] [place], [place], [address] Phone: [phone]   Fax:  [phone]  Name: AVREY HYSER MRN: [id number] Date of Birth: Sep 06, 1955

## 2019-12-02 NOTE — Patient Instructions (Addendum)
   Access Code: [email or web address] URL: [email or web address] Date: 12/02/2019 Prepared by: Rudell Cobb  Exercises Gastroc Stretch on Wall - 2 x daily - 7 x weekly - 1 sets - 3 reps - 30 seconds hold Standing Soleus Stretch - 2 x daily - 7 x weekly - 1 sets - 3 reps - 30 seconds hold Calf Mobilization with Foam Roll - 2 x daily - 7 x weekly - 1 sets - 1 reps - 3 minutes hold Squat with Chair Touch - 2 x daily - 7 x weekly - 1 sets - 10 reps Seated Table Hamstring Stretch - 2 x daily - 7 x weekly - 1 sets - 3 reps - 30 seconds hold Single Leg Stance - 2 x daily - 7 x weekly - 1 sets - 3 reps - 10-15 seconds hold X Band Walk - 2 x daily - 7 x weekly - 1 sets - 10 reps Arch Lifting - 2 x daily - 7 x weekly - 1 sets - 10 reps Seated Great Toe Extension - 2 x daily - 7 x weekly - 1 sets - 10 reps

## 2019-12-07 ENCOUNTER — Ambulatory Visit: Payer: 59 | Admitting: Sports Medicine

## 2019-12-08 ENCOUNTER — Other Ambulatory Visit (HOSPITAL_COMMUNITY): Payer: Self-pay | Admitting: *Deleted

## 2019-12-09 MED FILL — LISINOPRIL 10 MG TABS: 10 | 30 days supply | Qty: 30 | Fill #0

## 2019-12-14 ENCOUNTER — Encounter: Payer: 59 | Admitting: Rehabilitative and Restorative Service Providers"

## 2020-01-11 ENCOUNTER — Other Ambulatory Visit: Payer: Self-pay | Admitting: Family Medicine

## 2020-01-11 MED FILL — LISINOPRIL 10 MG TABS: 10 | 90 days supply | Qty: 90 | Fill #0

## 2020-04-04 MED FILL — LISINOPRIL 10 MG TABS: 10 | 90 days supply | Qty: 90 | Fill #1

## 2020-04-18 ENCOUNTER — Encounter: Payer: 59 | Admitting: Family Medicine

## 2020-04-20 ENCOUNTER — Telehealth: Payer: Self-pay

## 2020-04-20 NOTE — Telephone Encounter (Signed)
Pt would like labs ordered prior to 5/30 physical appt.   Please place orders. Thanks

## 2020-04-22 ENCOUNTER — Other Ambulatory Visit: Payer: Self-pay | Admitting: Family Medicine

## 2020-04-22 DIAGNOSIS — I10 Essential (primary) hypertension: Secondary | ICD-10-CM

## 2020-04-22 DIAGNOSIS — Z0000 Encounter for general adult medical examination without abnormal findings: Secondary | ICD-10-CM

## 2020-04-22 DIAGNOSIS — N400 Enlarged prostate without lower urinary tract symptoms: Secondary | ICD-10-CM

## 2020-04-22 DIAGNOSIS — Z13220 Encounter for screening for lipoid disorders: Secondary | ICD-10-CM

## 2020-04-22 NOTE — Telephone Encounter (Signed)
Orders entered

## 2020-04-22 NOTE — Telephone Encounter (Signed)
Pt has been advised that labs have been ordered. He may have them completed a few days prior to his appt. to ensure the results are available during his appt.

## 2020-04-26 ENCOUNTER — Other Ambulatory Visit (HOSPITAL_BASED_OUTPATIENT_CLINIC_OR_DEPARTMENT_OTHER): Payer: Self-pay

## 2020-05-04 ENCOUNTER — Encounter: Payer: 59 | Admitting: Family Medicine

## 2020-05-26 ENCOUNTER — Encounter: Payer: 59 | Admitting: Family Medicine

## 2020-05-30 DIAGNOSIS — Z13220 Encounter for screening for lipoid disorders: Secondary | ICD-10-CM | POA: Diagnosis not present

## 2020-05-30 DIAGNOSIS — Z0000 Encounter for general adult medical examination without abnormal findings: Secondary | ICD-10-CM | POA: Diagnosis not present

## 2020-05-30 DIAGNOSIS — N400 Enlarged prostate without lower urinary tract symptoms: Secondary | ICD-10-CM | POA: Diagnosis not present

## 2020-05-30 DIAGNOSIS — I10 Essential (primary) hypertension: Secondary | ICD-10-CM | POA: Diagnosis not present

## 2020-05-30 LAB — COMPLETE METABOLIC PANEL WITH GFR
AG Ratio: 2.2 (calc) (ref 1.0–2.5)
ALT: 20 U/L (ref 9–46)
AST: 23 U/L (ref 10–35)
Albumin: 4.4 g/dL (ref 3.6–5.1)
Alkaline phosphatase (APISO): 70 U/L (ref 35–144)
BUN/Creatinine Ratio: 14 (calc) (ref 6–22)
BUN: 19 mg/dL (ref 7–25)
CO2: 26 mmol/L (ref 20–32)
Calcium: 9.5 mg/dL (ref 8.6–10.3)
Chloride: 110 mmol/L (ref 98–110)
Creat: 1.38 mg/dL — ABNORMAL HIGH (ref 0.70–1.25)
GFR, Est African American: 63 mL/min/{1.73_m2} (ref >=60)
GFR, Est Non African American: 54 mL/min/{1.73_m2} — ABNORMAL LOW (ref >=60)
Globulin: 2 g/dL (calc) (ref 1.9–3.7)
Glucose, Bld: 104 mg/dL — ABNORMAL HIGH (ref 65–99)
Potassium: 4.4 mmol/L (ref 3.5–5.3)
Sodium: 143 mmol/L (ref 135–146)
Total Bilirubin: 0.7 mg/dL (ref 0.2–1.2)
Total Protein: 6.4 g/dL (ref 6.1–8.1)

## 2020-05-30 LAB — LIPID PANEL W/REFLEX DIRECT LDL
Cholesterol: 167 mg/dL (ref <200)
HDL: 71 mg/dL (ref >=40)
LDL Cholesterol (Calc): 84 mg/dL (calc)
Non-HDL Cholesterol (Calc): 96 mg/dL (calc) (ref <130)
Total CHOL/HDL Ratio: 2.4 (calc) (ref <5.0)
Triglycerides: 43 mg/dL (ref <150)

## 2020-05-30 LAB — TSH: TSH: 1.12 mIU/L (ref 0.40–4.50)

## 2020-05-30 LAB — CBC
HCT: 43.6 % (ref 38.5–50.0)
Hemoglobin: 15 g/dL (ref 13.2–17.1)
MCH: 31.1 pg (ref 27.0–33.0)
MCHC: 34.4 g/dL (ref 32.0–36.0)
MCV: 90.3 fL (ref 80.0–100.0)
MPV: 10.9 fL (ref 7.5–12.5)
Platelets: 206 10*3/uL (ref 140–400)
RBC: 4.83 10*6/uL (ref 4.20–5.80)
RDW: 12.9 % (ref 11.0–15.0)
WBC: 4.5 10*3/uL (ref 3.8–10.8)

## 2020-05-30 LAB — PSA: PSA: 1.47 ng/mL (ref <=4.00)

## 2020-06-08 ENCOUNTER — Ambulatory Visit (INDEPENDENT_AMBULATORY_CARE_PROVIDER_SITE_OTHER): Payer: 59

## 2020-06-08 ENCOUNTER — Encounter: Payer: Self-pay | Admitting: Family Medicine

## 2020-06-08 ENCOUNTER — Ambulatory Visit (INDEPENDENT_AMBULATORY_CARE_PROVIDER_SITE_OTHER): Payer: 59 | Admitting: Family Medicine

## 2020-06-08 ENCOUNTER — Other Ambulatory Visit: Payer: Self-pay

## 2020-06-08 VITALS — BP 144/78 | HR 61 | Temp 97.7°F | Ht 69.5 in | Wt 192.1 lb

## 2020-06-08 DIAGNOSIS — R002 Palpitations: Secondary | ICD-10-CM

## 2020-06-08 DIAGNOSIS — Z0000 Encounter for general adult medical examination without abnormal findings: Secondary | ICD-10-CM

## 2020-06-08 NOTE — Patient Instructions (Signed)

## 2020-06-08 NOTE — Assessment & Plan Note (Signed)
EKG with sinus bradycardia.  Left anterior fasicular block.  Normal Qtc and PR intervals.  No ischemic signs.  Will order zio patch for further evaluation.  Can consider echo as well if this is abnormal.

## 2020-06-08 NOTE — Progress Notes (Unsigned)
Patient enrolled for [employer] to ship a 7 day ZIO XT holter monitor to his home.

## 2020-06-08 NOTE — Progress Notes (Signed)
John Tran - 65 y.o. male MRN [id number]  Date of birth: 1955/10/23  Subjective Chief Complaint  Patient presents with  . Annual Exam    HPI John Tran is a 65 y.o. male here today for annual exam.  He has history of HTN that has been well controlled with lisinopril.   Seen by urology annually for BPH.   He has noted some palpiations and feeling oirregular heart rate at time recently.  Denies chest pain or dyspnea.  He does exercise frequently and it doesn't bother him but he does notice sometimes afterwards.    He is a non-smoker.  He has a few glasses of wine per week.  He feels like his diet is healthy.   Review of Systems  Constitutional: Negative for chills, fever, malaise/fatigue and weight loss.  HENT: Negative for congestion, ear pain and sore throat.   Eyes: Negative for blurred vision, double vision and pain.  Respiratory: Negative for cough and shortness of breath.   Cardiovascular: Negative for chest pain and palpitations.  Gastrointestinal: Negative for abdominal pain, blood in stool, constipation, heartburn and nausea.  Genitourinary: Negative for dysuria and urgency.  Musculoskeletal: Negative for joint pain and myalgias.  Neurological: Negative for dizziness and headaches.  Endo/Heme/Allergies: Does not bruise/bleed easily.  Psychiatric/Behavioral: Negative for depression. The patient is not nervous/anxious and does not have insomnia.     No Known Allergies  Past Medical History:  Diagnosis Date  . Arthritis   . Hypertension     Past Surgical History:  Procedure Laterality Date  . TONSILLECTOMY      Social History   Socioeconomic History  . Marital status: Married    Spouse name: Not on file  . Number of children: 3  . Years of education: Not on file  . Highest education level: Not on file  Occupational History  . Not on file  Tobacco Use  . Smoking status: Never Smoker  . Smokeless tobacco: Never Used  Vaping Use  . Vaping Use: Never  used  Substance and Sexual Activity  . Alcohol use: Yes    Alcohol/week: 4.0 standard drinks    Types: 4 Glasses of wine per week  . Drug use: Never  . Sexual activity: Yes    Partners: Female  Other Topics Concern  . Not on file  Social History Narrative  . Not on file   Social Determinants of Health   Financial Resource Strain: Not on file  Food Insecurity: Not on file  Transportation Needs: Not on file  Physical Activity: Not on file  Stress: Not on file  Social Connections: Not on file    Family History  Problem Relation Age of Onset  . Hypertension Mother   . Skin cancer Mother   . Prostate cancer Father     Health Maintenance  Topic Date Due  . Hepatitis C Screening  Never done  . HIV Screening  Never done  . COVID-19 Vaccine (3 - Booster for Pfizer series) 09/10/2019  . INFLUENZA VACCINE  09/05/2020  . TETANUS/TDAP  09/12/2023  . COLONOSCOPY (Pts 45-55yrs Insurance coverage will need to be confirmed)  11/20/2028  . HPV VACCINES  Aged Out     ----------------------------------------------------------------------------------------------------------------------------------------------------------------------------------------------------------------- Physical Exam BP (!) 144/78 (BP Location: Left Arm, Patient Position: Sitting, Cuff Size: Normal)   Pulse 61   Temp 97.7 F (36.5 C)   Ht 5' 9.5" (1.765 m)   Wt 192 lb 1.6 oz (87.1 kg)   SpO2 100%  BMI 27.96 kg/m   Physical Exam Constitutional:      General: He is not in acute distress. HENT:     Head: Normocephalic and atraumatic.     Right Ear: Tympanic membrane and external ear normal.     Left Ear: Tympanic membrane and external ear normal.  Eyes:     General: No scleral icterus. Neck:     Thyroid: No thyromegaly.  Cardiovascular:     Rate and Rhythm: Normal rate and regular rhythm.     Heart sounds: Normal heart sounds.  Pulmonary:     Effort: Pulmonary effort is normal.     Breath sounds:  Normal breath sounds.  Abdominal:     General: Bowel sounds are normal. There is no distension.     Palpations: Abdomen is soft.     Tenderness: There is no abdominal tenderness. There is no guarding.  Musculoskeletal:     Cervical back: Normal range of motion.  Lymphadenopathy:     Cervical: No cervical adenopathy.  Skin:    General: Skin is warm and dry.     Findings: No rash.  Neurological:     Mental Status: He is alert and oriented to person, place, and time.     Cranial Nerves: No cranial nerve deficit.     Motor: No abnormal muscle tone.  Psychiatric:        Behavior: Behavior normal.     ------------------------------------------------------------------------------------------------------------------------------------------------------------------------------------------------------------------- Assessment and Plan  Well adult exam Well adult Orders Placed This Encounter  Procedures  . LONG TERM MONITOR (3-14 DAYS)    Standing Status:   Future    Number of Occurrences:   1    Standing Expiration Date:   06/08/2021    Order Specific Question:   Where should this test be performed?    Answer:   [facility]    Order Specific Question:   Does the patient have an implanted cardiac device?    Answer:   No    Order Specific Question:   Prescribed days of wear    Answer:   7  . EKG 12-Lead  Screening:  Recent labs reviewed with him.  Immunizations: UTD Anticipatory guidance/Risk factor reduction:  Recommendations per AVS   Palpitations EKG with sinus bradycardia.  Left anterior fasicular block.  Normal Qtc and PR intervals.  No ischemic signs.  Will order zio patch for further evaluation.  Can consider echo as well if this is abnormal.     No orders of the defined types were placed in this encounter.   Return in about 6 months (around 12/09/2020) for HTN.    This visit occurred during the SARS-CoV-2 public health emergency.  Safety protocols were in place,  including screening questions prior to the visit, additional usage of staff PPE, and extensive cleaning of exam room while observing appropriate contact time as indicated for disinfecting solutions.

## 2020-06-08 NOTE — Assessment & Plan Note (Signed)
Well adult Orders Placed This Encounter  Procedures  . LONG TERM MONITOR (3-14 DAYS)    Standing Status:   Future    Number of Occurrences:   1    Standing Expiration Date:   06/08/2021    Order Specific Question:   Where should this test be performed?    Answer:   [facility]    Order Specific Question:   Does the patient have an implanted cardiac device?    Answer:   No    Order Specific Question:   Prescribed days of wear    Answer:   7  . EKG 12-Lead  Screening:  Recent labs reviewed with him.  Immunizations: UTD Anticipatory guidance/Risk factor reduction:  Recommendations per AVS

## 2020-06-11 DIAGNOSIS — R002 Palpitations: Secondary | ICD-10-CM | POA: Diagnosis not present

## 2020-06-24 DIAGNOSIS — R002 Palpitations: Secondary | ICD-10-CM | POA: Diagnosis not present

## 2020-06-27 ENCOUNTER — Encounter: Payer: Self-pay | Admitting: Family Medicine

## 2020-09-12 ENCOUNTER — Other Ambulatory Visit (HOSPITAL_COMMUNITY): Payer: Self-pay

## 2020-09-12 MED FILL — Lisinopril Tab 10 MG: ORAL | 90 days supply | Qty: 90 | Fill #0 | Status: AC

## 2020-11-09 ENCOUNTER — Other Ambulatory Visit: Payer: Self-pay

## 2020-11-09 ENCOUNTER — Encounter: Payer: Self-pay | Admitting: Orthopaedic Surgery

## 2020-11-09 ENCOUNTER — Ambulatory Visit: Payer: 59 | Admitting: Orthopaedic Surgery

## 2020-11-09 DIAGNOSIS — M19021 Primary osteoarthritis, right elbow: Secondary | ICD-10-CM | POA: Diagnosis not present

## 2020-11-09 NOTE — Progress Notes (Signed)
Office Visit Note   Patient: John Tran           Date of Birth: 05-10-1955           MRN: [id number] Visit Date: 11/09/2020              Requested by: Luetta Nutting, [address] [place],  [place] [address] PCP: Luetta Nutting, DO   Assessment & Plan: Visit Diagnoses:  1. Primary osteoarthritis of right elbow     Plan: Impression is right elbow arthritis with loose body.  Given worsening symptoms and the fact that he is interested in pursuing surgical treatment we will need an updated CT scan for full evaluation of the extent of his degenerative joint disease as well as the location of loose bodies for surgical planning.  I will call the patient with the results.  He is looking at surgery sometime in January.  Follow-Up Instructions: No follow-ups on file.   Orders:  No orders of the defined types were placed in this encounter.  No orders of the defined types were placed in this encounter.     Procedures: No procedures performed   Clinical Data: No additional findings.   Subjective: Chief Complaint  Patient presents with   Right Elbow - Follow-up    HPI  John Tran is a very pleasant 65 year old gentleman who works in Engineer, technical sales for [employer] comes back today for worsening right elbow pain and loss of range of motion due to osteoarthritis and loose bodies.  We obtained an MRI about a year ago which showed a large loose body in the olecranon fossa and I sent him to Dr. Junius Roads to try an injection.  He feels that the injection really did not give any relief and he has noticed increased popping and a sensation of slipping with decreased range of motion and difficulty with daily activities.  He is interested in pursuing surgical treatment.  Review of Systems   Objective: Vital Signs: There were no vitals taken for this visit.  Physical Exam  Ortho Exam  Right elbow shows moderate pain with passive range of motion.  He lacks about 15 to 20 degrees of full  elbow extension.  He is able to flex to about 110 degrees comfortably with moderate pain past that.  Collateral ligaments are stable.  Specialty Comments:  No specialty comments available.  Imaging: No results found.   PMFS History: Patient Active Problem List   Diagnosis Date Noted   Well adult exam 06/08/2020   Palpitations 06/08/2020   Primary osteoarthritis of right elbow 10/22/2019   Achilles tendinosis of left lower extremity 10/22/2019   Primary insomnia 08/19/2018   Tear of medial meniscus of right knee, current 06/12/2017   Benign essential hypertension 04/21/2015   BPH (benign prostatic hyperplasia) 09/11/2013   Elevated BP 07/28/2012   Past Medical History:  Diagnosis Date   Arthritis    Hypertension     Family History  Problem Relation Age of Onset   Hypertension Mother    Skin cancer Mother    Prostate cancer Father     Past Surgical History:  Procedure Laterality Date   TONSILLECTOMY     Social History   Occupational History   Not on file  Tobacco Use   Smoking status: Never   Smokeless tobacco: Never  Vaping Use   Vaping Use: Never used  Substance and Sexual Activity   Alcohol use: Yes    Alcohol/week: 4.0 standard  drinks    Types: 4 Glasses of wine per week   Drug use: Never   Sexual activity: Yes    Partners: Female

## 2020-11-09 NOTE — Addendum Note (Signed)
Addended by: Precious Bard on: 11/09/2020 01:59 PM   Modules accepted: Orders

## 2020-12-07 ENCOUNTER — Other Ambulatory Visit: Payer: 59

## 2020-12-09 ENCOUNTER — Other Ambulatory Visit: Payer: 59

## 2020-12-09 ENCOUNTER — Ambulatory Visit: Payer: 59 | Admitting: Family Medicine

## 2020-12-14 ENCOUNTER — Ambulatory Visit: Payer: 59 | Admitting: Family Medicine

## 2020-12-14 ENCOUNTER — Other Ambulatory Visit: Payer: Self-pay

## 2020-12-14 ENCOUNTER — Encounter: Payer: Self-pay | Admitting: Family Medicine

## 2020-12-14 VITALS — BP 138/76 | HR 65 | Temp 97.8°F | Ht 69.5 in | Wt 193.6 lb

## 2020-12-14 DIAGNOSIS — R350 Frequency of micturition: Secondary | ICD-10-CM | POA: Diagnosis not present

## 2020-12-14 DIAGNOSIS — N401 Enlarged prostate with lower urinary tract symptoms: Secondary | ICD-10-CM | POA: Diagnosis not present

## 2020-12-14 DIAGNOSIS — I10 Essential (primary) hypertension: Secondary | ICD-10-CM

## 2020-12-14 NOTE — Assessment & Plan Note (Signed)
He does have enlargement of the prostate on digital exam without any palpable nodules.  He noted some increased symptoms.  Update PSA today.

## 2020-12-14 NOTE — Patient Instructions (Signed)
Continue to monitor BP at home.  We'll be in touch with PSA.  Follow up with me in 6 months.

## 2020-12-14 NOTE — Assessment & Plan Note (Signed)
Blood pressure stable at this time.  He does have a component of whitecoat hypertension.  He will continue lisinopril at current strength and home monitoring.

## 2020-12-14 NOTE — Progress Notes (Signed)
John Tran - 65 y.o. male MRN [id number]  Date of birth: Mar 16, 1955  Subjective Chief Complaint  Patient presents with   Benign Prostatic Hypertrophy   Hypertension    HPI John Tran is a 65 year old male here today for follow-up visit.  Reports he is doing well with lisinopril for management of hypertension.  Blood pressure at home has been well controlled.  He has not had any symptoms related to hypertension including chest pain, shortness of breath, palpitations, headache or vision changes.  He is concerned about prostate enlargement.  He has had some increased urinary frequency and urgency.  His father died from prostate cancer in his 11s.  His PSA levels have been stable.  He would like to have a digital exam today.  ROS:  A comprehensive ROS was completed and negative except as noted per HPI  No Known Allergies  Past Medical History:  Diagnosis Date   Arthritis    Hypertension     Past Surgical History:  Procedure Laterality Date   TONSILLECTOMY      Social History   Socioeconomic History   Marital status: Married    Spouse name: Not on file   Number of children: 3   Years of education: Not on file   Highest education level: Not on file  Occupational History   Not on file  Tobacco Use   Smoking status: Never   Smokeless tobacco: Never  Vaping Use   Vaping Use: Never used  Substance and Sexual Activity   Alcohol use: Yes    Alcohol/week: 4.0 standard drinks    Types: 4 Glasses of wine per week   Drug use: Never   Sexual activity: Yes    Partners: Female  Other Topics Concern   Not on file  Social History Narrative   Not on file   Social Determinants of Health   Financial Resource Strain: Not on file  Food Insecurity: Not on file  Transportation Needs: Not on file  Physical Activity: Not on file  Stress: Not on file  Social Connections: Not on file    Family History  Problem Relation Age of Onset   Hypertension Mother    Skin cancer Mother     Prostate cancer Father     Health Maintenance  Topic Date Due   HIV Screening  Never done   Hepatitis C Screening  Never done   Zoster Vaccines- Shingrix (1 of 2) Never done   COVID-19 Vaccine (3 - Booster for Pfizer series) 05/08/2019   TETANUS/TDAP  09/12/2023   COLONOSCOPY (Pts 45-76yrs Insurance coverage will need to be confirmed)  11/20/2028   INFLUENZA VACCINE  Completed   Pneumococcal Vaccine 82-38 Years old  Aged Out   HPV VACCINES  Aged Out     ----------------------------------------------------------------------------------------------------------------------------------------------------------------------------------------------------------------- Physical Exam BP (!) 161/83 (BP Location: Left Arm, Patient Position: Sitting, Cuff Size: Normal)   Pulse 65   Temp 97.8 F (36.6 C)   Ht 5' 9.5" (1.765 m)   Wt 193 lb 9.6 oz (87.8 kg)   SpO2 96%   BMI 28.18 kg/m   Physical Exam Constitutional:      Appearance: Normal appearance.  Eyes:     General: No scleral icterus. Cardiovascular:     Rate and Rhythm: Normal rate and regular rhythm.  Pulmonary:     Effort: Pulmonary effort is normal.     Breath sounds: Normal breath sounds.  Genitourinary:    Comments: Prostate is symmetrically enlarged without tenderness or nodularity. Musculoskeletal:  Cervical back: Neck supple.  Neurological:     General: No focal deficit present.     Mental Status: He is alert.  Psychiatric:        Mood and Affect: Mood normal.        Behavior: Behavior normal.    ------------------------------------------------------------------------------------------------------------------------------------------------------------------------------------------------------------------- Assessment and Plan  Benign essential hypertension Blood pressure stable at this time.  He does have a component of whitecoat hypertension.  He will continue lisinopril at current strength and home  monitoring.  BPH (benign prostatic hyperplasia) He does have enlargement of the prostate on digital exam without any palpable nodules.  He noted some increased symptoms.  Update PSA today.   No orders of the defined types were placed in this encounter.   Return in about 6 months (around 06/13/2021) for HTN.    This visit occurred during the SARS-CoV-2 public health emergency.  Safety protocols were in place, including screening questions prior to the visit, additional usage of staff PPE, and extensive cleaning of exam room while observing appropriate contact time as indicated for disinfecting solutions.

## 2020-12-15 LAB — PSA: PSA: 1.29 ng/mL (ref <=4.00)

## 2020-12-16 ENCOUNTER — Other Ambulatory Visit: Payer: Self-pay | Admitting: Orthopaedic Surgery

## 2020-12-16 DIAGNOSIS — M19021 Primary osteoarthritis, right elbow: Secondary | ICD-10-CM

## 2020-12-20 ENCOUNTER — Ambulatory Visit
Admission: RE | Admit: 2020-12-20 | Discharge: 2020-12-20 | Disposition: A | Discharge status: Home / Self Care | Payer: 59 | Source: Ambulatory Visit | Attending: Orthopaedic Surgery | Admitting: Orthopaedic Surgery

## 2020-12-20 DIAGNOSIS — M19021 Primary osteoarthritis, right elbow: Secondary | ICD-10-CM

## 2020-12-20 DIAGNOSIS — R937 Abnormal findings on diagnostic imaging of other parts of musculoskeletal system: Secondary | ICD-10-CM | POA: Diagnosis not present

## 2020-12-22 ENCOUNTER — Other Ambulatory Visit (HOSPITAL_COMMUNITY): Payer: Self-pay

## 2020-12-22 ENCOUNTER — Other Ambulatory Visit: Payer: Self-pay | Admitting: Family Medicine

## 2020-12-22 DIAGNOSIS — I10 Essential (primary) hypertension: Secondary | ICD-10-CM

## 2020-12-22 MED ORDER — LISINOPRIL 10 MG PO TABS
10.0000 mg | ORAL_TABLET | Freq: Every day | ORAL | 2 refills | Status: DC
  Filled 2020-12-22: qty 90, 90d supply, fill #0
  Filled 2021-03-24: qty 90, 90d supply, fill #1
  Filled 2021-06-22: qty 90, 90d supply, fill #2

## 2020-12-26 ENCOUNTER — Telehealth: Payer: Self-pay | Admitting: Orthopaedic Surgery

## 2020-12-26 ENCOUNTER — Other Ambulatory Visit (HOSPITAL_COMMUNITY): Payer: Self-pay

## 2020-12-26 NOTE — Telephone Encounter (Signed)
Patient returned call to Dr Erlinda Hong. The number to contact patient is [phone]

## 2020-12-27 ENCOUNTER — Telehealth: Payer: Self-pay | Admitting: Orthopaedic Surgery

## 2020-12-27 NOTE — Telephone Encounter (Signed)
Pt states he returning a call to Dr. Erlinda Hong for test results. Please call pt at [phone].

## 2021-01-03 DIAGNOSIS — Z1283 Encounter for screening for malignant neoplasm of skin: Secondary | ICD-10-CM | POA: Diagnosis not present

## 2021-01-03 DIAGNOSIS — L821 Other seborrheic keratosis: Secondary | ICD-10-CM | POA: Diagnosis not present

## 2021-01-03 DIAGNOSIS — Z85828 Personal history of other malignant neoplasm of skin: Secondary | ICD-10-CM | POA: Diagnosis not present

## 2021-01-10 ENCOUNTER — Ambulatory Visit: Payer: 59 | Admitting: Orthopaedic Surgery

## 2021-01-10 ENCOUNTER — Other Ambulatory Visit: Payer: Self-pay

## 2021-01-10 ENCOUNTER — Encounter: Payer: Self-pay | Admitting: Orthopaedic Surgery

## 2021-01-10 DIAGNOSIS — M19021 Primary osteoarthritis, right elbow: Secondary | ICD-10-CM | POA: Diagnosis not present

## 2021-01-10 NOTE — Progress Notes (Signed)
   Office Visit Note   Patient: John Tran           Date of Birth: [birth year]           MRN: [id number] Visit Date: 01/10/2021              Requested by: Luetta Nutting, [place] [address],  [place] [id number] PCP: Luetta Nutting, DO   Assessment & Plan: Visit Diagnoses:  1. Primary osteoarthritis of right elbow     Plan: Alicia returns today to discuss right elbow CT scan for surgical planning of right elbow osteoarthritis and loose bodies.  Since we last saw him he feels like his pain and range of motion have gotten worse.  He is having more difficulty playing his guitar.  Right elbow examination is unchanged.  CT scan of the right elbow shows 2 large loose bodies in the posterior olecranon fossa and small osteophyte and loose body in the anterior joint near the coronoid.  These findings were reviewed with Quita Skye in detail and based on the lack of improvement from conservative management I have recommended elbow arthrotomy with loose body removal and Outerbridge Kashiwagi procedure.  Details of the surgery including risk benefits rehab recovery reviewed with the patient detail.  Jackelyn Poling will reach out to him in the near future to schedule surgery for sometime in January.  Follow-Up Instructions: No follow-ups on file.   Orders:  No orders of the defined types were placed in this encounter.  No orders of the defined types were placed in this encounter.     Procedures: No procedures performed   Clinical Data: No additional findings.   Subjective: Chief Complaint  Patient presents with   Right Elbow - Pain    HPI  Review of Systems   Objective: Vital Signs: There were no vitals taken for this visit.  Physical Exam  Ortho Exam  Specialty Comments:  No specialty comments available.  Imaging: No results found.   PMFS History: Patient Active Problem List   Diagnosis Date Noted   Well adult exam 06/08/2020   Palpitations 06/08/2020    Primary osteoarthritis of right elbow 10/22/2019   Achilles tendinosis of left lower extremity 10/22/2019   Primary insomnia 08/19/2018   Tear of medial meniscus of right knee, current 06/12/2017   Benign essential hypertension 04/21/2015   BPH (benign prostatic hyperplasia) 09/11/2013   Past Medical History:  Diagnosis Date   Arthritis    Hypertension     Family History  Problem Relation Age of Onset   Hypertension Mother    Skin cancer Mother    Prostate cancer Father     Past Surgical History:  Procedure Laterality Date   TONSILLECTOMY     Social History   Occupational History   Not on file  Tobacco Use   Smoking status: Never   Smokeless tobacco: Never  Vaping Use   Vaping Use: Never used  Substance and Sexual Activity   Alcohol use: Yes    Alcohol/week: 4.0 standard drinks    Types: 4 Glasses of wine per week   Drug use: Never   Sexual activity: Yes    Partners: Female

## 2021-02-16 ENCOUNTER — Telehealth: Payer: Self-pay | Admitting: Orthopaedic Surgery

## 2021-02-16 NOTE — Telephone Encounter (Signed)
Patent's right elbow arthrotomy, LB removal,Outerbridge-Kashiwagi procedure is scheduled for 03-01-21 at [facility].  He would like to get an idea of when he can resume typing on the computer.  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  Surgery is on Wednesday, so would he need to wait until Monday to resume work from home or would he be ok the following day of surgery to type on a laptop?  Please call and advise patient  [phone]

## 2021-02-16 NOTE — Telephone Encounter (Signed)
1-2 weeks from home should be sufficient.

## 2021-02-17 NOTE — Telephone Encounter (Signed)
Sent mychart msg.

## 2021-03-01 ENCOUNTER — Ambulatory Visit (HOSPITAL_BASED_OUTPATIENT_CLINIC_OR_DEPARTMENT_OTHER): Admit: 2021-03-01 | Payer: 59 | Admitting: Orthopaedic Surgery

## 2021-03-01 ENCOUNTER — Encounter (HOSPITAL_BASED_OUTPATIENT_CLINIC_OR_DEPARTMENT_OTHER): Payer: Self-pay

## 2021-03-01 SURGERY — ARTHROTOMY
Anesthesia: Choice | Laterality: Right

## 2021-03-09 ENCOUNTER — Encounter: Payer: 59 | Admitting: Orthopaedic Surgery

## 2021-03-10 DIAGNOSIS — M19021 Primary osteoarthritis, right elbow: Secondary | ICD-10-CM | POA: Diagnosis not present

## 2021-03-24 ENCOUNTER — Other Ambulatory Visit (HOSPITAL_COMMUNITY): Payer: Self-pay

## 2021-06-13 ENCOUNTER — Ambulatory Visit: Payer: 59 | Admitting: Family Medicine

## 2021-06-22 ENCOUNTER — Other Ambulatory Visit (HOSPITAL_COMMUNITY): Payer: Self-pay

## 2021-07-13 ENCOUNTER — Ambulatory Visit: Payer: 59 | Admitting: Family Medicine

## 2021-07-23 ENCOUNTER — Other Ambulatory Visit: Payer: Self-pay

## 2021-07-23 ENCOUNTER — Emergency Department
Admission: EM | Admit: 2021-07-23 | Discharge: 2021-07-23 | Disposition: A | Discharge status: Home / Self Care | Payer: 59 | Source: Home / Self Care

## 2021-07-23 ENCOUNTER — Emergency Department (INDEPENDENT_AMBULATORY_CARE_PROVIDER_SITE_OTHER): Payer: 59

## 2021-07-23 DIAGNOSIS — M19042 Primary osteoarthritis, left hand: Secondary | ICD-10-CM | POA: Diagnosis not present

## 2021-07-23 DIAGNOSIS — S6992XA Unspecified injury of left wrist, hand and finger(s), initial encounter: Secondary | ICD-10-CM | POA: Diagnosis not present

## 2021-07-23 DIAGNOSIS — S63502A Unspecified sprain of left wrist, initial encounter: Secondary | ICD-10-CM

## 2021-07-23 DIAGNOSIS — M19032 Primary osteoarthritis, left wrist: Secondary | ICD-10-CM | POA: Diagnosis not present

## 2021-07-23 MED ORDER — PREDNISONE 20 MG PO TABS
20.0000 mg | ORAL_TABLET | Freq: Every day | ORAL | 0 refills | Status: AC
Start: 2021-07-23 — End: 2021-07-28

## 2021-07-23 NOTE — ED Provider Notes (Signed)
John Tran    CSN: [id number] Arrival date & time: 07/23/21  1341      History   Chief Complaint Chief Complaint  Patient presents with   Hand Injury    HPI John Tran is a 66 y.o. male.   HPI Patient reports while hiking 1 day ago he failed and injured his hand and wrist on the right.  He initially did not experience severe swelling however on today his hand is swollen and he is having some impaired range of motion with his wrist.  He also has significant bruising however admits to taking daily ASA 81 mg for cardiac health.  He did not lose consciousness or hit his head.  He is here to rule out an acute fracture involving his hand and wrist.  Past Medical History:  Diagnosis Date   Arthritis    Hypertension     Patient Active Problem List   Diagnosis Date Noted   Well adult exam 06/08/2020   Palpitations 06/08/2020   Primary osteoarthritis of right elbow 10/22/2019   Achilles tendinosis of left lower extremity 10/22/2019   Primary insomnia 08/19/2018   Tear of medial meniscus of right knee, current 06/12/2017   Benign essential hypertension 04/21/2015   BPH (benign prostatic hyperplasia) 09/11/2013    Past Surgical History:  Procedure Laterality Date   TONSILLECTOMY         Home Medications    Prior to Admission medications   Medication Sig Start Date End Date Taking? Authorizing Provider  predniSONE (DELTASONE) 20 MG tablet Take 1 tablet (20 mg total) by mouth daily with breakfast for 5 days. 07/23/21 07/28/21 Yes Scot Jun, FNP  aspirin 81 MG chewable tablet Chew by mouth.    [provider]  B Complex Vitamins (VITAMIN B-COMPLEX PO) Take by mouth.    [provider]  Cholecalciferol (VITAMIN D) 125 MCG (5000 UT) CAPS Take by mouth.    [provider]  lisinopril (ZESTRIL) 10 MG tablet Take 1 tablet (10 mg total) by mouth daily. 12/22/20 12/22/21  Luetta Nutting, DO  Magnesium 250 MG TABS Take by mouth.     [provider]  melatonin 5 MG TABS Take 5 mg by mouth.    [provider]    Family History Family History  Problem Relation Age of Onset   Cancer Mother    Hypertension Mother    Skin cancer Mother    Prostate cancer Father     Social History Social History   Tobacco Use   Smoking status: Never   Smokeless tobacco: Never  Vaping Use   Vaping Use: Never used  Substance Use Topics   Alcohol use: Yes    Alcohol/week: 4.0 standard drinks of alcohol    Types: 4 Glasses of wine per week    Comment: weekly   Drug use: Never     Allergies   Patient has no known allergies.   Review of Systems Review of Systems Pertinent negatives listed in HPI   Physical Exam Triage Vital Signs ED Triage Vitals  Enc Vitals Group     BP 07/23/21 1404 137/79     Pulse Rate 07/23/21 1404 69     Resp 07/23/21 1404 20     Temp 07/23/21 1404 98.2 F (36.8 C)     Temp Source 07/23/21 1404 Oral     SpO2 07/23/21 1404 99 %     Weight 07/23/21 1400 180 lb (81.6 kg)  Height 07/23/21 1400 '5\' 10"'$  (1.778 m)     Head Circumference --      Peak Flow --      Pain Score 07/23/21 1359 6     Pain Loc --      Pain Edu? --      Excl. in [place]? --    No data found.  Updated Vital Signs BP 137/79 (BP Location: Right Arm)   Pulse 69   Temp 98.2 F (36.8 C) (Oral)   Resp 20   Ht '5\' 10"'$  (1.778 m)   Wt 180 lb (81.6 kg)   SpO2 99%   BMI 25.83 kg/m   Visual Acuity Right Eye Distance:   Left Eye Distance:   Bilateral Distance:    Right Eye Near:   Left Eye Near:    Bilateral Near:     Physical Exam Constitutional:      Appearance: Normal appearance.  HENT:     Head: Normocephalic and atraumatic.  Eyes:     Extraocular Movements: Extraocular movements intact.     Pupils: Pupils are equal, round, and reactive to light.  Cardiovascular:     Rate and Rhythm: Normal rate and regular rhythm.  Pulmonary:     Effort: Pulmonary effort is normal.     Breath sounds:  Normal breath sounds.  Musculoskeletal:     Right wrist: Normal.     Left wrist: Swelling present. Decreased range of motion.     Left hand: Swelling and bony tenderness present. No deformity. Decreased range of motion.     Cervical back: Normal range of motion and neck supple.     Comments: Generalized ecchymosis involving left hand   Skin:    General: Skin is warm and dry.     Capillary Refill: Capillary refill takes less than 2 seconds.  Neurological:     Mental Status: He is alert.  Psychiatric:        Mood and Affect: Mood normal.        Behavior: Behavior normal.      UC Treatments / Results  Labs (all labs ordered are listed, but only abnormal results are displayed) Labs Reviewed - No data to display  EKG   Radiology DG Wrist Complete Left  Result Date: 07/23/2021 CLINICAL DATA:  Left hand and wrist injury after fall while hiking. EXAM: LEFT WRIST - COMPLETE 3+ VIEW COMPARISON:  None Available. FINDINGS: Mild degenerative change over the radiocarpal joint, radial side of the carpal bones and first and second carpometacarpal joints. No acute fracture or dislocation. IMPRESSION: No acute findings. Electronically Signed   By: Marin Olp M.D.   On: 07/23/2021 14:38   DG Hand Complete Left  Result Date: 07/23/2021 CLINICAL DATA:  Left hand and wrist injury after fall while hiking. EXAM: LEFT HAND - COMPLETE 3+ VIEW COMPARISON:  None Available. FINDINGS: Mild degenerate change over the radiocarpal joint with mild degenerate change over the radial side of the carpal bones as well as the first and second carpometacarpal joints. Minimal degenerate change over the first and third MCP joints. No acute fracture or dislocation. IMPRESSION: 1. No acute findings. 2. Degenerative changes as described. Electronically Signed   By: Marin Olp M.D.   On: 07/23/2021 14:36    Procedures Procedures (including critical care time)  Medications Ordered in UC Medications - No data to  display  Initial Impression / Assessment and Plan / UC Course  I have reviewed the triage vital signs and the nursing notes.  Pertinent labs & imaging results that were available during my care of the patient were reviewed by me and considered in my medical decision making (see chart for details).    Left wrist and left hand injury Imaging negative for acute fracture. Splint for comfort PRN Start prednisone 20 mg daily x 5 days. Follow-up with  Final Clinical Impressions(s) / UC Diagnoses   Final diagnoses:  Sprain of left wrist, initial encounter  Hand injury, left, initial encounter     Discharge Instructions      Continue wear of splint for comfort for the next 5-7 days or until swelling has resolved. For inflammation, I have prescribed a low dose of prednisone 20 mg daily for 5 days. This will help reduce stiffness, joint swelling and pain related to fall. If your symptoms worsen or do not readily improve, I have included follow-up information with [facility] in [place].      ED Prescriptions     Medication Sig Dispense Auth. Provider   predniSONE (DELTASONE) 20 MG tablet Take 1 tablet (20 mg total) by mouth daily with breakfast for 5 days. 5 tablet Scot Jun, FNP      PDMP not reviewed this encounter.   Scot Jun, FNP 07/24/21 1422

## 2021-07-23 NOTE — ED Triage Notes (Addendum)
Pt presents to Urgent Care with c/o L hand/wrist pain following injury 2 days ago. Reports falling backward while backpacking and hitting L hand on a rock. Significant bruising and swelling noted.

## 2021-07-23 NOTE — Discharge Instructions (Signed)
Continue wear of splint for comfort for the next 5-7 days or until swelling has resolved. For inflammation, I have prescribed a low dose of prednisone 20 mg daily for 5 days. This will help reduce stiffness, joint swelling and pain related to fall. If your symptoms worsen or do not readily improve, I have included follow-up information with [facility] in [place].

## 2021-08-02 ENCOUNTER — Ambulatory Visit: Payer: 59 | Admitting: Sports Medicine

## 2021-08-02 DIAGNOSIS — M7989 Other specified soft tissue disorders: Secondary | ICD-10-CM

## 2021-08-02 MED ORDER — DOXYCYCLINE HYCLATE 100 MG PO TABS: 100.0000 mg | ORAL_TABLET | Freq: Two times a day (BID) | ORAL | 0 refills | Status: AC

## 2021-08-02 MED ORDER — PREDNISONE 10 MG (48) PO TBPK: ORAL_TABLET | Freq: Every day | ORAL | 0 refills | Status: DC

## 2021-08-02 NOTE — Assessment & Plan Note (Signed)
Pleasant 66 year old male, had a fall about 11 days ago when hiking, developed immediate swelling, some redness, bruising. Was seen in urgent care where x-rays of the hand and wrist were negative for fracture. He was given low-dose prednisone at 20 mg daily. Swelling is improved to some degree but still has significant discomfort first webspace as well as thenar eminence. It is erythematous, hot. I am concerned about an infection so we will do doxycycline twice a day for 7 days. From an orthopedic standpoint he has good strength to all motions, his UCL of the thumb is stable and intact, and I am really not able to reproduce any pain with movement of the thumb itself making this more of a skin versus muscular injury. Adding additional prednisone, we will do a 12-day taper. Return to see me in about 3 weeks.

## 2021-08-02 NOTE — Progress Notes (Signed)
    Procedures performed today:    None.  Independent interpretation of notes and tests performed by another provider:   None.  Brief History, Exam, Impression, and Recommendations:    Swelling of thumb, left Pleasant 66 year old male, had a fall about 11 days ago when hiking, developed immediate swelling, some redness, bruising. Was seen in urgent care where x-rays of the hand and wrist were negative for fracture. He was given low-dose prednisone at 20 mg daily. Swelling is improved to some degree but still has significant discomfort first webspace as well as thenar eminence. It is erythematous, hot. I am concerned about an infection so we will do doxycycline twice a day for 7 days. From an orthopedic standpoint he has good strength to all motions, his UCL of the thumb is stable and intact, and I am really not able to reproduce any pain with movement of the thumb itself making this more of a skin versus muscular injury. Adding additional prednisone, we will do a 12-day taper. Return to see me in about 3 weeks.    ____________________________________________ Gwen Her. Dianah Field, M.D., ABFM., CAQSM., AME. Primary Care and Sports Medicine [facility]  Adjunct Professor of [employer]  Risk manager

## 2021-08-17 ENCOUNTER — Other Ambulatory Visit (HOSPITAL_COMMUNITY): Payer: Self-pay

## 2021-08-17 ENCOUNTER — Encounter: Payer: Self-pay | Admitting: Family Medicine

## 2021-08-17 ENCOUNTER — Ambulatory Visit: Payer: 59 | Admitting: Family Medicine

## 2021-08-17 VITALS — BP 115/65 | HR 68 | Ht 70.0 in | Wt 183.0 lb

## 2021-08-17 DIAGNOSIS — N401 Enlarged prostate with lower urinary tract symptoms: Secondary | ICD-10-CM | POA: Diagnosis not present

## 2021-08-17 DIAGNOSIS — I10 Essential (primary) hypertension: Secondary | ICD-10-CM | POA: Diagnosis not present

## 2021-08-17 DIAGNOSIS — M7989 Other specified soft tissue disorders: Secondary | ICD-10-CM

## 2021-08-17 DIAGNOSIS — Z13220 Encounter for screening for lipoid disorders: Secondary | ICD-10-CM | POA: Diagnosis not present

## 2021-08-17 MED ORDER — LISINOPRIL 10 MG PO TABS
10.0000 mg | ORAL_TABLET | Freq: Every day | ORAL | 3 refills | Status: DC
Start: 2021-08-17 — End: 2022-09-20
  Filled 2021-08-17 – 2021-09-11 (×2): qty 90, 90d supply, fill #0
  Filled 2021-12-18: qty 90, 90d supply, fill #1
  Filled 2022-03-20: qty 90, 90d supply, fill #2
  Filled 2022-06-13: qty 90, 90d supply, fill #3

## 2021-08-17 NOTE — Patient Instructions (Signed)
Schedule annual physical.  You may have labs completed prior to visit.

## 2021-08-17 NOTE — Assessment & Plan Note (Signed)
Still with mild pain, overall much improved.

## 2021-08-17 NOTE — Assessment & Plan Note (Signed)
BP is well controlled with lisinopril at current strength.  Recommend continuation.  Updating labs today.

## 2021-08-17 NOTE — Progress Notes (Signed)
John Tran - 66 y.o. male MRN [id number]  Date of birth: 1956/01/29  Subjective Chief Complaint  Patient presents with   Hand Injury   Hypertension    HPI John Tran is a 66 y.o. male here today for follow up visit.    He is doing well with lisinopril for management of HTN.  He denies side effects related to medication.  He has not had chest pain, shortness of breath, palpitations ,headache or vision changes.  He continues to stay active and follows a low sodium diet.    Seen by Dr. Dianah Field for thumb pain recently.  Injured during fall while hiking.  Doxycycline and prednisone added for inflammation and possible infection.  Today reports that swelling has improved and pain is better.  Still with mild pain.  Has upcoming f/u with Dr. Dianah Field.  ROS:  A comprehensive ROS was completed and negative except as noted per HPI   No Known Allergies  Past Medical History:  Diagnosis Date   Arthritis    Hypertension     Past Surgical History:  Procedure Laterality Date   TONSILLECTOMY      Social History   Socioeconomic History   Marital status: Married    Spouse name: Not on file   Number of children: 3   Years of education: Not on file   Highest education level: Not on file  Occupational History   Not on file  Tobacco Use   Smoking status: Never   Smokeless tobacco: Never  Vaping Use   Vaping Use: Never used  Substance and Sexual Activity   Alcohol use: Yes    Alcohol/week: 4.0 standard drinks of alcohol    Types: 4 Glasses of wine per week    Comment: weekly   Drug use: Never   Sexual activity: Not on file  Other Topics Concern   Not on file  Social History Narrative   Not on file   Social Determinants of Health   Financial Resource Strain: Not on file  Food Insecurity: Not on file  Transportation Needs: Not on file  Physical Activity: Not on file  Stress: Not on file  Social Connections: Not on file    Family History  Problem Relation Age  of Onset   Cancer Mother    Hypertension Mother    Skin cancer Mother    Prostate cancer Father     Health Maintenance  Topic Date Due   HIV Screening  Never done   Hepatitis C Screening  Never done   COVID-19 Vaccine (3 - Pfizer series) 09/02/2021 (Originally 05/08/2019)   Zoster Vaccines- Shingrix (1 of 2) 11/17/2021 (Originally 06/06/2006)   Pneumonia Vaccine 93+ Years old (1 - PCV) 08/18/2022 (Originally 06/05/2021)   INFLUENZA VACCINE  09/05/2021   TETANUS/TDAP  09/12/2023   COLONOSCOPY (Pts 45-9yr Insurance coverage will need to be confirmed)  11/20/2028   HPV VACCINES  Aged Out     ----------------------------------------------------------------------------------------------------------------------------------------------------------------------------------------------------------------- Physical Exam BP 115/65 (BP Location: Left Arm, Patient Position: Sitting, Cuff Size: Large)   Pulse 68   Ht '5\' 10"'$  (1.778 m)   Wt 183 lb (83 kg)   SpO2 99%   BMI 26.26 kg/m   Physical Exam Constitutional:      Appearance: Normal appearance.  Eyes:     General: No scleral icterus. Cardiovascular:     Rate and Rhythm: Normal rate and regular rhythm.  Pulmonary:     Effort: Pulmonary effort is normal.     Breath sounds:  Normal breath sounds.  Musculoskeletal:     Cervical back: Neck supple.  Neurological:     General: No focal deficit present.     Mental Status: He is alert.  Psychiatric:        Mood and Affect: Mood normal.        Behavior: Behavior normal.     ------------------------------------------------------------------------------------------------------------------------------------------------------------------------------------------------------------------- Assessment and Plan  Benign essential hypertension BP is well controlled with lisinopril at current strength.  Recommend continuation.  Updating labs today.   BPH (benign prostatic hyperplasia) Update PSA  today.   Swelling of thumb, left Still with mild pain, overall much improved.     Meds ordered this encounter  Medications   lisinopril (ZESTRIL) 10 MG tablet    Sig: Take 1 tablet (10 mg total) by mouth daily.    Dispense:  90 tablet    Refill:  3    No follow-ups on file.    This visit occurred during the SARS-CoV-2 public health emergency.  Safety protocols were in place, including screening questions prior to the visit, additional usage of staff PPE, and extensive cleaning of exam room while observing appropriate contact time as indicated for disinfecting solutions.

## 2021-08-17 NOTE — Assessment & Plan Note (Signed)
Update PSA today.  

## 2021-08-28 ENCOUNTER — Ambulatory Visit: Payer: 59 | Admitting: Sports Medicine

## 2021-08-28 DIAGNOSIS — M7712 Lateral epicondylitis, left elbow: Secondary | ICD-10-CM

## 2021-08-28 DIAGNOSIS — M7989 Other specified soft tissue disorders: Secondary | ICD-10-CM

## 2021-08-28 NOTE — Assessment & Plan Note (Signed)
Pleasant 66 year old male, I saw him approximately a month ago for increasing swelling left first webspace, pain was not referable to the thumb basal joint. X-rays did show widespread osteoarthritis, we added some steroids and antibiotics and he has returned today with complete symptom resolution.

## 2021-08-28 NOTE — Assessment & Plan Note (Signed)
Classic lateral epicondylitis, home conditioning given, return to see me as needed.

## 2021-08-28 NOTE — Progress Notes (Signed)
    Procedures performed today:    None.  Independent interpretation of notes and tests performed by another provider:   None.  Brief History, Exam, Impression, and Recommendations:    Swelling of thumb, left Pleasant 66 year old male, I saw him approximately a month ago for increasing swelling left first webspace, pain was not referable to the thumb basal joint. X-rays did show widespread osteoarthritis, we added some steroids and antibiotics and he has returned today with complete symptom resolution.   Left tennis elbow Classic lateral epicondylitis, home conditioning given, return to see me as needed.    ____________________________________________ Gwen Her. Dianah Field, M.D., ABFM., CAQSM., AME. Primary Care and Sports Medicine [facility]  Adjunct Professor of [employer]  Risk manager

## 2021-09-11 ENCOUNTER — Other Ambulatory Visit (HOSPITAL_COMMUNITY): Payer: Self-pay

## 2021-12-18 ENCOUNTER — Other Ambulatory Visit (HOSPITAL_COMMUNITY): Payer: Self-pay

## 2022-01-04 DIAGNOSIS — X58XXXA Exposure to other specified factors, initial encounter: Secondary | ICD-10-CM | POA: Diagnosis not present

## 2022-01-04 DIAGNOSIS — R072 Precordial pain: Secondary | ICD-10-CM | POA: Diagnosis not present

## 2022-01-04 DIAGNOSIS — Y999 Unspecified external cause status: Secondary | ICD-10-CM | POA: Diagnosis not present

## 2022-01-04 DIAGNOSIS — T18108A Unspecified foreign body in esophagus causing other injury, initial encounter: Secondary | ICD-10-CM | POA: Diagnosis not present

## 2022-01-12 ENCOUNTER — Encounter: Payer: Self-pay | Admitting: Family Medicine

## 2022-01-12 ENCOUNTER — Ambulatory Visit: Payer: 59 | Admitting: Family Medicine

## 2022-01-12 VITALS — BP 145/85 | HR 60 | Ht 70.0 in | Wt 193.0 lb

## 2022-01-12 DIAGNOSIS — R1319 Other dysphagia: Secondary | ICD-10-CM

## 2022-01-12 DIAGNOSIS — I10 Essential (primary) hypertension: Secondary | ICD-10-CM | POA: Diagnosis not present

## 2022-01-12 DIAGNOSIS — L209 Atopic dermatitis, unspecified: Secondary | ICD-10-CM | POA: Diagnosis not present

## 2022-01-12 MED ORDER — CLOBETASOL PROPIONATE 0.05 % EX CREA: 1.0000 | TOPICAL_CREAM | Freq: Two times a day (BID) | CUTANEOUS | 0 refills | Status: AC

## 2022-01-12 NOTE — Assessment & Plan Note (Signed)
Adding clobetasol cream.  He will let me know if not improving.

## 2022-01-12 NOTE — Progress Notes (Signed)
John Tran - 66 y.o. male MRN [id number]  Date of birth: 1955/10/22  Subjective Chief Complaint  Patient presents with   Rash   Dysphagia    HPI John Tran is a 66 y.o. male here today for recent ED follow up.   He reports that he had Thanksgiving Dinner and felt like food was stuck.  Had increased pain with this and went to the ED.  While in the waiting room he felt like this finally passed.  Pased PO challenge in the ED.  PPI started and recommend referral to GI.  Since this time he has not had any issues but he did have this in the past, just not as severe. Pantoprazole added by ED physician.   He also has rash on the inside of th R leg.  This is very itchy.  He has tried topical hydrocortisone 1% without much improvement.   ROS:  A comprehensive ROS was completed and negative except as noted per HPI  No Known Allergies  Past Medical History:  Diagnosis Date   Arthritis    Hypertension     Past Surgical History:  Procedure Laterality Date   TONSILLECTOMY      Social History   Socioeconomic History   Marital status: Married    Spouse name: Not on file   Number of children: 3   Years of education: Not on file   Highest education level: Not on file  Occupational History   Not on file  Tobacco Use   Smoking status: Never   Smokeless tobacco: Never  Vaping Use   Vaping Use: Never used  Substance and Sexual Activity   Alcohol use: Yes    Alcohol/week: 4.0 standard drinks of alcohol    Types: 4 Glasses of wine per week    Comment: weekly   Drug use: Never   Sexual activity: Not on file  Other Topics Concern   Not on file  Social History Narrative   Not on file   Social Determinants of Health   Financial Resource Strain: Not on file  Food Insecurity: Not on file  Transportation Needs: Not on file  Physical Activity: Not on file  Stress: Not on file  Social Connections: Not on file    Family History  Problem Relation Age of Onset   Cancer Mother     Hypertension Mother    Skin cancer Mother    Prostate cancer Father     Health Maintenance  Topic Date Due   HIV Screening  Never done   Hepatitis C Screening  Never done   Zoster Vaccines- Shingrix (1 of 2) Never done   INFLUENZA VACCINE  09/05/2021   COVID-19 Vaccine (3 - 2023-24 season) 10/06/2021   Pneumonia Vaccine 25+ Years old (1 - PCV) 08/18/2022 (Originally 06/05/2021)   DTaP/Tdap/Td (2 - Td or Tdap) 09/12/2023   COLONOSCOPY (Pts 45-58yr Insurance coverage will need to be confirmed)  11/20/2028   HPV VACCINES  Aged Out     ----------------------------------------------------------------------------------------------------------------------------------------------------------------------------------------------------------------- Physical Exam BP (!) 145/85 (BP Location: Left Arm, Patient Position: Sitting, Cuff Size: Normal)   Pulse 60   Ht '5\' 10"'$  (1.778 m)   Wt 193 lb (87.5 kg)   SpO2 99%   BMI 27.69 kg/m   Physical Exam Constitutional:      Appearance: Normal appearance.  Eyes:     General: No scleral icterus. Cardiovascular:     Rate and Rhythm: Normal rate and regular rhythm.  Pulmonary:  Effort: Pulmonary effort is normal.     Breath sounds: Normal breath sounds.  Neurological:     General: No focal deficit present.     Mental Status: He is alert.  Psychiatric:        Mood and Affect: Mood normal.        Behavior: Behavior normal.     ------------------------------------------------------------------------------------------------------------------------------------------------------------------------------------------------------------------- Assessment and Plan  Benign essential hypertension BP is mildly elevated.  Readings at home have been well controlled.   Esophageal dysphagia Referral placed to GI.  Continue pantoprazole. Small bites, increased fluids recommended.   Atopic dermatitis Adding clobetasol cream.  He will let me know if  not improving.     Meds ordered this encounter  Medications   clobetasol cream (TEMOVATE) 0.05 %    Sig: Apply 1 Application topically 2 (two) times daily.    Dispense:  30 g    Refill:  0    No follow-ups on file.    This visit occurred during the SARS-CoV-2 public health emergency.  Safety protocols were in place, including screening questions prior to the visit, additional usage of staff PPE, and extensive cleaning of exam room while observing appropriate contact time as indicated for disinfecting solutions.

## 2022-01-12 NOTE — Assessment & Plan Note (Signed)
BP is mildly elevated.  Readings at home have been well controlled.

## 2022-01-12 NOTE — Patient Instructions (Signed)
Try clobetasol cream on the leg. Let me know if not improving with this.

## 2022-01-12 NOTE — Assessment & Plan Note (Addendum)
Referral placed to GI.  Continue pantoprazole. Small bites, increased fluids recommended.

## 2022-02-08 ENCOUNTER — Telehealth: Payer: Self-pay | Admitting: Gastroenterology

## 2022-02-08 NOTE — Telephone Encounter (Signed)
Good Afternoon Dr. Havery Moros  Supervising Provider   We received a referral for this patient for esophageal dysphagia. This patient had a colonoscopy in 2020 with Dr. Bryn Gulling at [facility]. However, since then the patient has become a [employer] employee and with his Insurance says it is beneficial for him to see a [facility] provider. Colonoscopy operative and pathology reports are in Media and Epic for your review. Please advise on scheduling.    Thank you.

## 2022-02-08 NOTE — Telephone Encounter (Signed)
Happy to see him in the office for dysphagia. Thanks

## 2022-02-09 ENCOUNTER — Encounter: Payer: Self-pay | Admitting: Gastroenterology

## 2022-02-09 NOTE — Telephone Encounter (Signed)
Patient was scheduled with you on 04/17/2022 at 3:00 pm.

## 2022-02-21 ENCOUNTER — Ambulatory Visit: Payer: Commercial Managed Care - PPO | Admitting: Orthopaedic Surgery

## 2022-03-07 ENCOUNTER — Ambulatory Visit: Payer: Commercial Managed Care - PPO | Admitting: Orthopaedic Surgery

## 2022-03-07 ENCOUNTER — Encounter: Payer: Self-pay | Admitting: Orthopaedic Surgery

## 2022-03-07 DIAGNOSIS — M19021 Primary osteoarthritis, right elbow: Secondary | ICD-10-CM

## 2022-03-07 NOTE — Progress Notes (Signed)
Office Visit Note   Patient: John Tran           Date of Birth: [birth year]           MRN: [id number] Visit Date: 03/07/2022              Requested by: Luetta Nutting, [address] [place],  [place] [address] PCP: Luetta Nutting, DO   Assessment & Plan: Visit Diagnoses:  1. Primary osteoarthritis of right elbow     Plan: Impression is advanced right elbow osteoarthritis with loose bodies in the olecranon fossa.  Findings were again reviewed with the patient and treatment options were discussed and at this point he is ready to move forward with scheduling surgery.  Details of the surgery discussed at length.  Plan is for elbow arthroscopy through a triceps splitting posterior approach with removal of the 2 large loose bodies as well as OK procedure.  Risk benefits prognosis reviewed.  Jackelyn Poling will call the patient to confirm surgery date.  Follow-Up Instructions: No follow-ups on file.   Orders:  No orders of the defined types were placed in this encounter.  No orders of the defined types were placed in this encounter.     Procedures: No procedures performed   Clinical Data: No additional findings.   Subjective: Chief Complaint  Patient presents with   Right Elbow - Pain    HPI  John Tran returns today for follow-up of right elbow osteoarthritis with loose bodies.  His last visit with [place] was in December 2022 and plans were for surgery in January 2023 but he decided to postpone surgery at that time.  He comes back now ready to reschedule surgery as his symptoms have gotten worse and his range of motion has gotten worse.  Review of Systems   Objective: Vital Signs: There were no vitals taken for this visit.  Physical Exam  Ortho Exam Examination of the right elbow shows pain and crepitus with range of motion.  His range of motion is 35 to 100 degrees.  He has a mechanical sensation in the posterior aspect of the elbow with elbow extension.   Neurovascularly intact distally. Specialty Comments:  No specialty comments available.  Imaging: No results found.   PMFS History: Patient Active Problem List   Diagnosis Date Noted   Esophageal dysphagia 01/12/2022   Atopic dermatitis 01/12/2022   Left tennis elbow 08/28/2021   Swelling of thumb, left 08/02/2021   Well adult exam 06/08/2020   Palpitations 06/08/2020   Primary osteoarthritis of right elbow 10/22/2019   Achilles tendinosis of left lower extremity 10/22/2019   Primary insomnia 08/19/2018   Tear of medial meniscus of right knee, current 06/12/2017   Benign essential hypertension 04/21/2015   BPH (benign prostatic hyperplasia) 09/11/2013   Past Medical History:  Diagnosis Date   Arthritis    Hypertension     Family History  Problem Relation Age of Onset   Cancer Mother    Hypertension Mother    Skin cancer Mother    Prostate cancer Father     Past Surgical History:  Procedure Laterality Date   TONSILLECTOMY     Social History   Occupational History   Not on file  Tobacco Use   Smoking status: Never   Smokeless tobacco: Never  Vaping Use   Vaping Use: Never used  Substance and Sexual Activity   Alcohol use: Yes    Alcohol/week: 4.0 standard drinks of alcohol  Types: 4 Glasses of wine per week    Comment: weekly   Drug use: Never   Sexual activity: Not on file

## 2022-03-15 ENCOUNTER — Telehealth: Payer: Self-pay | Admitting: Orthopaedic Surgery

## 2022-03-15 NOTE — Telephone Encounter (Signed)
Patient would like to hold off on right elbow surgery (ARTHROTOMY).  He states he is not in any pain for now, just has less mobility.  He will contact us when is ready for surgery.

## 2022-03-20 ENCOUNTER — Other Ambulatory Visit (HOSPITAL_COMMUNITY): Payer: Self-pay

## 2022-04-10 ENCOUNTER — Encounter: Payer: Self-pay | Admitting: Gastroenterology

## 2022-04-17 ENCOUNTER — Ambulatory Visit: Payer: Commercial Managed Care - PPO | Admitting: Gastroenterology

## 2022-05-21 IMAGING — MR MR ELBOW*R* W/O CM
4 of 5 series · 20 of 40 positions shown · non-contrast
Comparison: None.

CLINICAL DATA: Right elbow pain and limited range of motion since a
fall backpacking 06/06/2019. Initial encounter.

EXAM:
MRI OF THE RIGHT ELBOW WITHOUT CONTRAST
TECHNIQUE: Multiplanar, multisequence MR imaging of the elbow was performed. No
intravenous contrast was administered.

[Series 3: T1 · axial · 3.5mm · 0.27mm/px · z∈[-43,+36]mm · 3 of 23 slices shown]
[im 3/23]
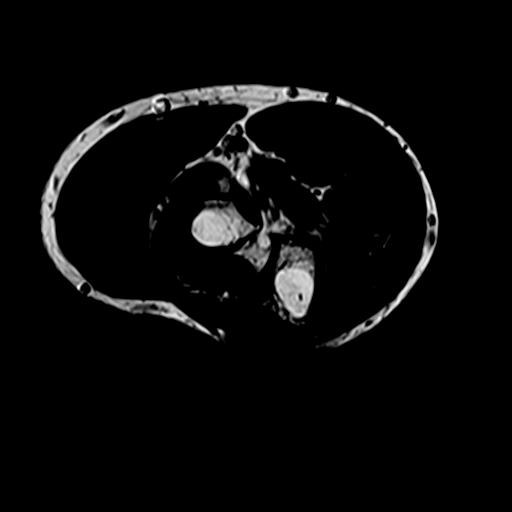
[im 12/23]
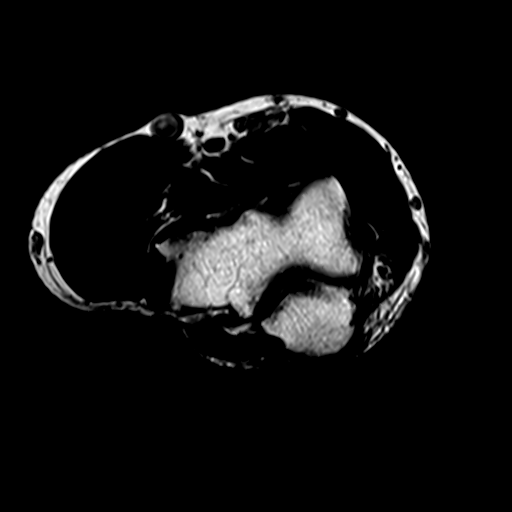
[im 20/23]
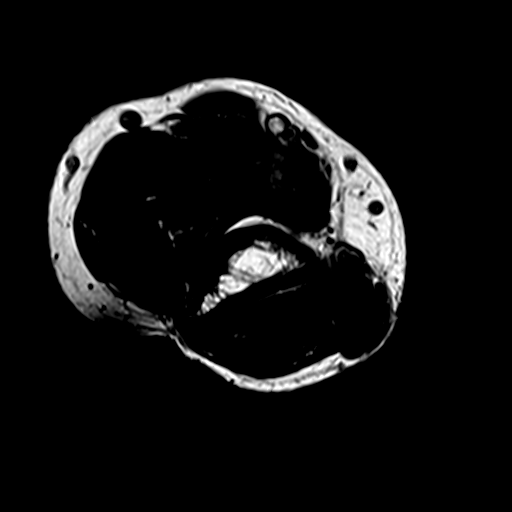

[Series 4: T2 fat-sat · axial · 3.5mm · 0.27mm/px · z∈[-52,+32]mm · 7 of 23 slices shown (1 of 2)]
[im 1/23]
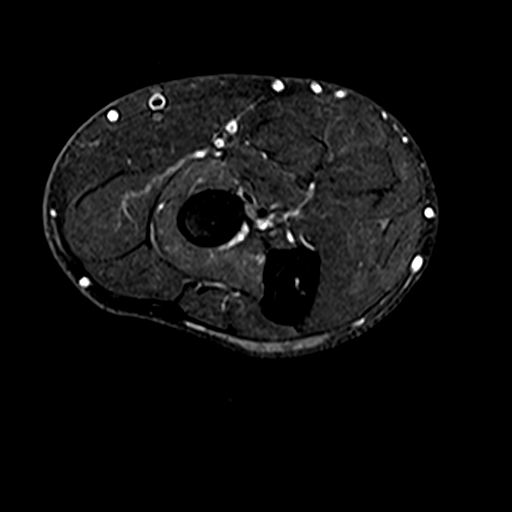
[im 4/23]
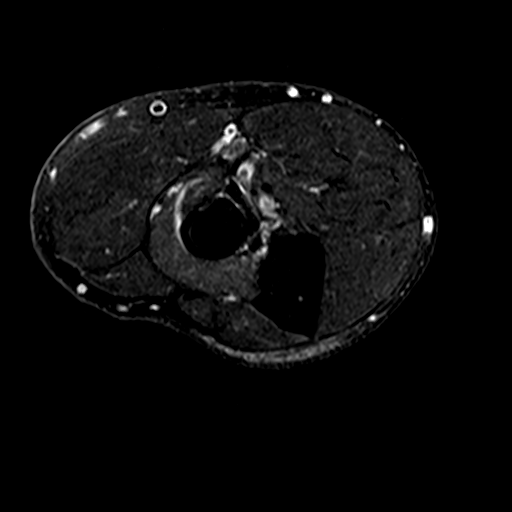
[im 7/23]
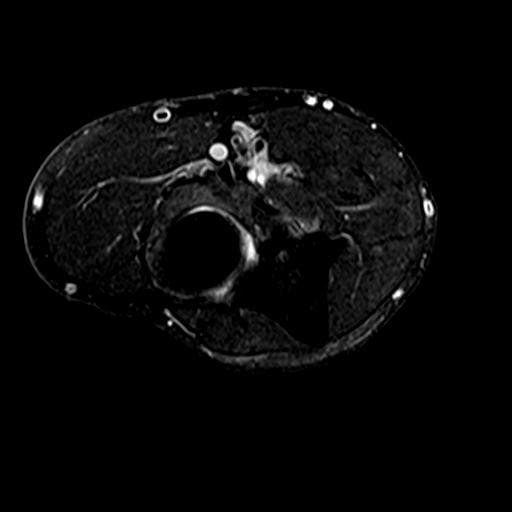
[im 10/23]
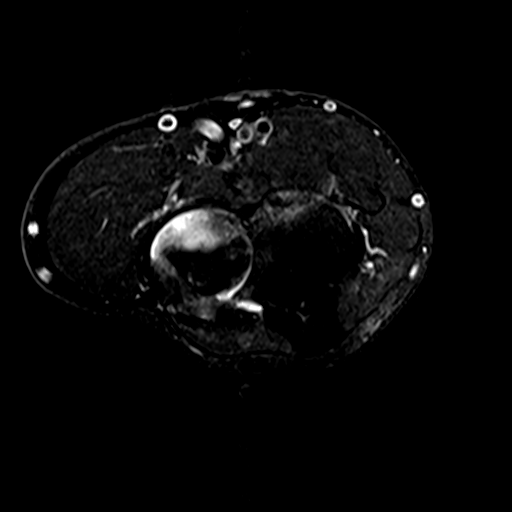
[im 13/23]
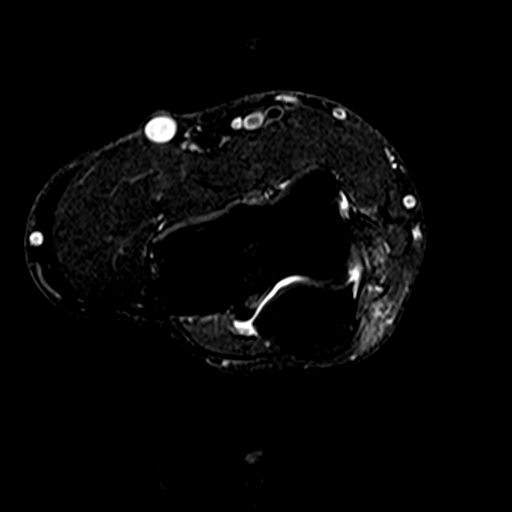
[im 16/23]
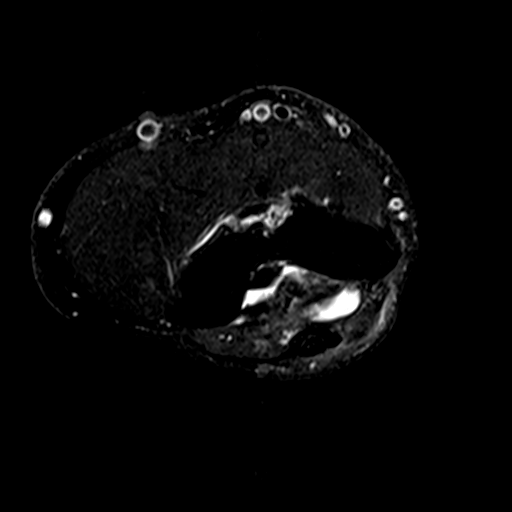
[im 19/23]
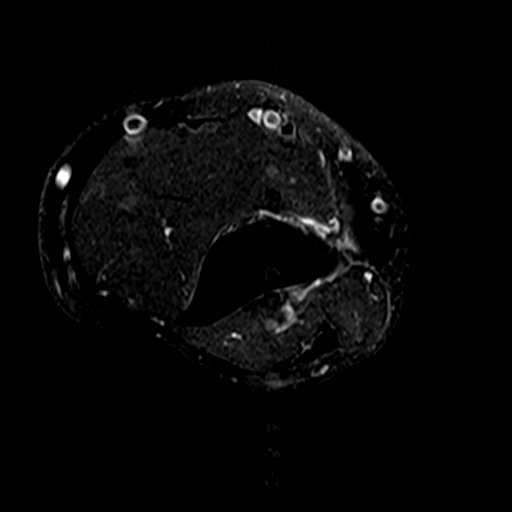

[Series 6: T2 fat-sat · coronal · 3.5mm · 0.31mm/px · 3 of 23 slices shown (2 of 2)]
[im 4/23]
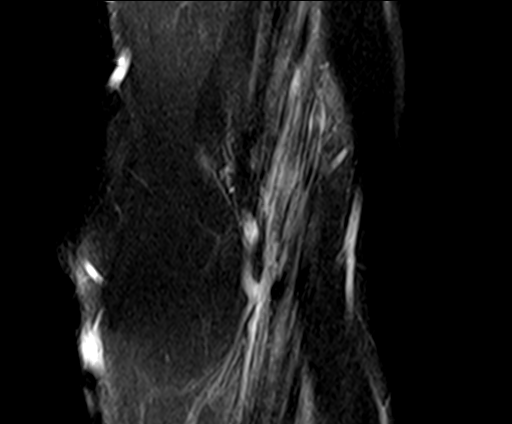
[im 13/23]
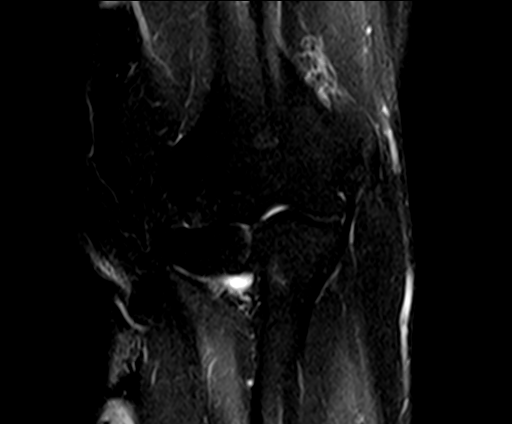
[im 19/23]
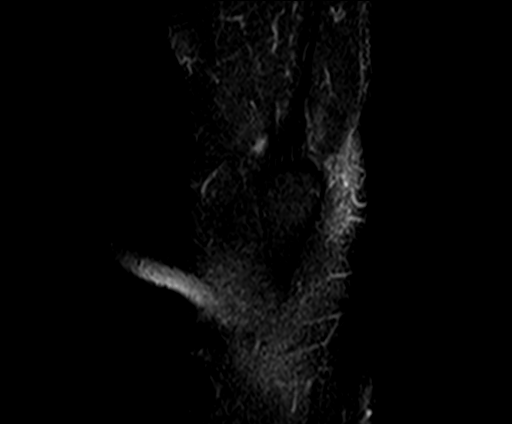

[Series 7: PD fat-sat · sagittal · 3.0mm · 0.27mm/px · 7 of 19 slices shown]
[im 1/19]
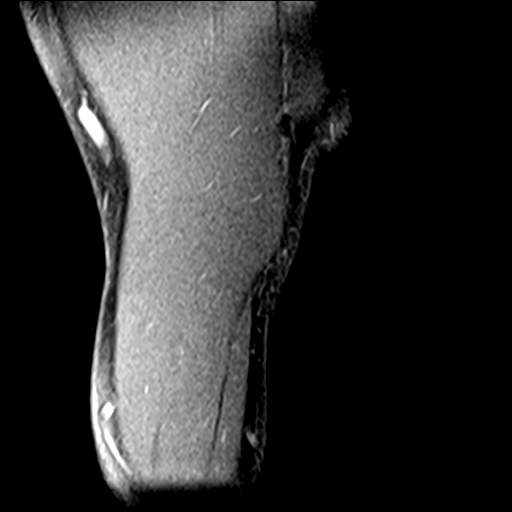
[im 4/19]
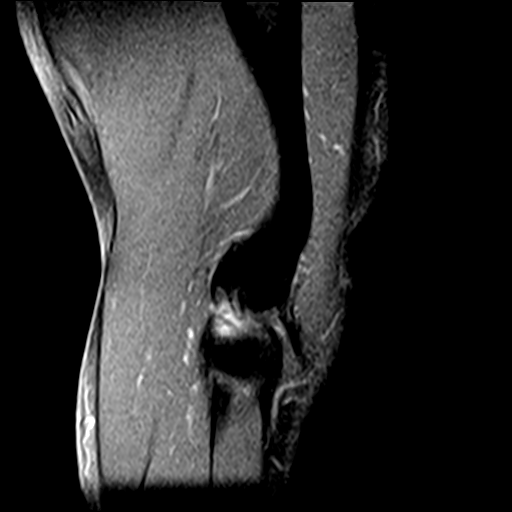
[im 7/19]
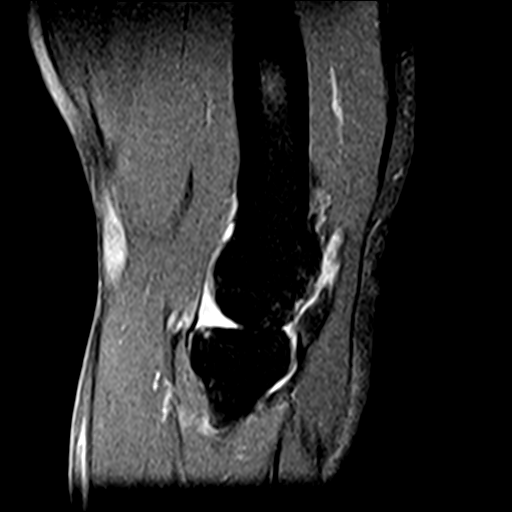
[im 10/19]
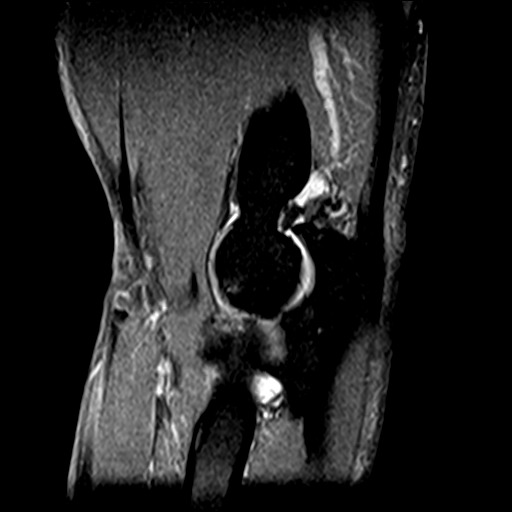
[im 13/19]
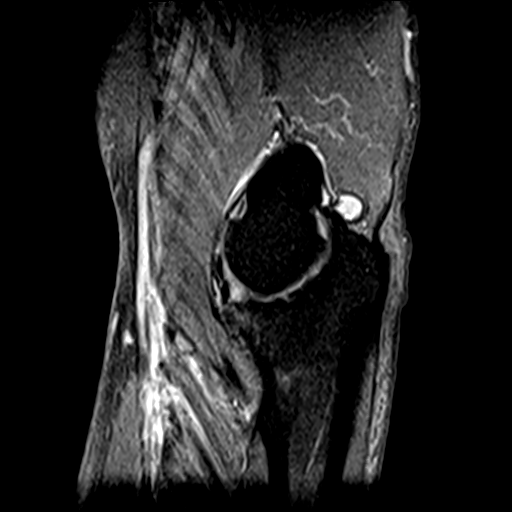
[im 16/19]
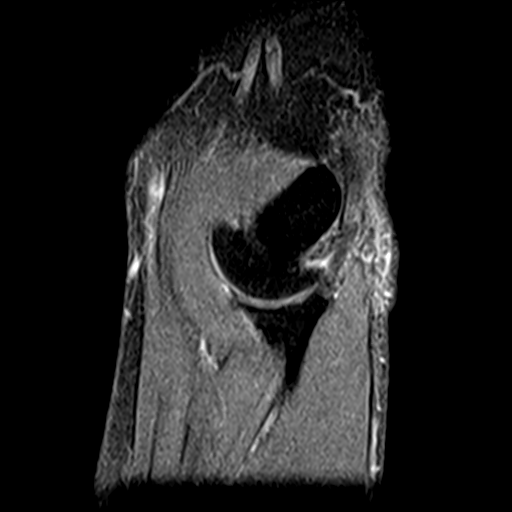
[im 19/19]
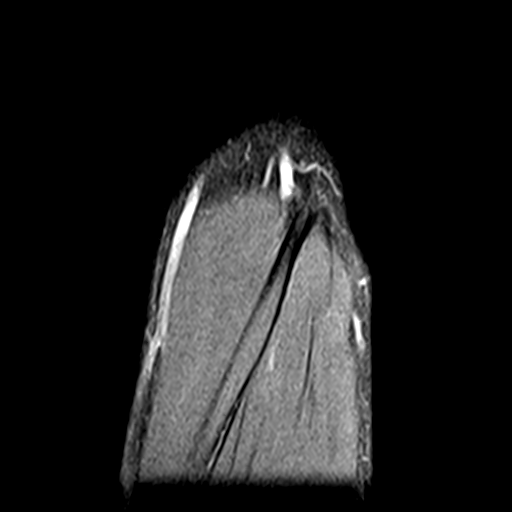

[20 of 40 positions shown; findings below may reference images not displayed]

FINDINGS: TENDONS

Common forearm flexor origin: Intact.

Common forearm extensor origin: Intact.

Biceps: Intact.

Triceps: Intact.

LIGAMENTS

Medial stabilizers: Intact.

Lateral stabilizers:  Intact.

Cartilage: The patient has advanced for age osteoarthritis about the
elbow with cartilage thinning throughout. Scattered small foci of
subchondral edema are identified and there is bulky osteophytosis. A
loose body in the olecranon fossa measuring approximately 1 cm in
diameter is identified.

Joint: Very small effusion.

Cubital tunnel: Normal.

Bones: No fracture, contusion or worrisome lesion.
IMPRESSION: Advanced osteoarthritis about the right elbow with a 1 cm in
diameter loose body in the olecranon fossa could account for limited
range of motion.

No evidence of trauma.  Negative for tendon ligament abnormality.

## 2022-06-13 ENCOUNTER — Other Ambulatory Visit (HOSPITAL_COMMUNITY): Payer: Self-pay

## 2022-06-20 ENCOUNTER — Ambulatory Visit: Payer: Commercial Managed Care - PPO | Admitting: Gastroenterology

## 2022-06-20 ENCOUNTER — Encounter: Payer: Self-pay | Admitting: Gastroenterology

## 2022-06-20 ENCOUNTER — Telehealth: Payer: Self-pay | Admitting: Gastroenterology

## 2022-06-20 VITALS — BP 138/80 | HR 60 | Ht 70.0 in | Wt 190.0 lb

## 2022-06-20 DIAGNOSIS — R1310 Dysphagia, unspecified: Secondary | ICD-10-CM | POA: Diagnosis not present

## 2022-06-20 NOTE — Patient Instructions (Addendum)
You have been scheduled for an endoscopy. Please follow written instructions given to you at your visit today. If you use inhalers (even only as needed), please bring them with you on the day of your procedure.   Thank you for entrusting me with your care and for choosing [facility], Dr. Ileene Patrick  If your blood pressure at your visit was 140/90 or greater, please contact your primary care physician to follow up on this. ______________________________________________________  If you are age 55 or older, your body mass index should be between 23-30. Your Body mass index is 27.26 kg/m. If this is out of the aforementioned range listed, please consider follow up with your Primary Care Provider.  If you are age 40 or younger, your body mass index should be between 19-25. Your Body mass index is 27.26 kg/m. If this is out of the aformentioned range listed, please consider follow up with your Primary Care Provider.  ________________________________________________________  The [facility] providers would like to encourage you to use [facility] to communicate with providers for non-urgent requests or questions.  Due to long hold times on the telephone, sending your provider a message by [facility] may be a faster and more efficient way to get a response.  Please allow 48 business hours for a response.  Please remember that this is for non-urgent requests.  _______________________________________________________  Due to recent changes in healthcare laws, you may see the results of your imaging and laboratory studies on MyChart before your provider has had a chance to review them.  We understand that in some cases there may be results that are confusing or concerning to you. Not all laboratory results come back in the same time frame and the provider may be waiting for multiple results in order to interpret others.  Please give [place] 48 hours in order for your provider to thoroughly review all the  results before contacting the office for clarification of your results.

## 2022-06-20 NOTE — Telephone Encounter (Signed)
Patient called stating that he was scheduled for his endoscopy today in office for 06/03. States he will not have transportation for that say, he is rescheduled for 06/12.

## 2022-06-20 NOTE — Progress Notes (Signed)
HPI :  67 year old male with history of hypertension, BPH, arthritis, here to establish care as a new patient, referred by Everrett Coombe, MD, for dysphagia.  He states he has had a few episodes over the years with food getting stuck in his esophagus.  Last episode was in November.  He ate a piece of [place] and states he got caught in his lower chest.  He states it would not go down nor come back up.  He had discomfort in his chest with it.  He went to the emergency room, he states it was there for about 2 and half hours and it eventually passed on its own prior to getting an endoscopy.  He states at baseline he is able to eat his food okay and denies any significant dysphagia to solids or liquids at baseline, but he has had a few episodes of food getting stuck historically as outlined above.  These tend to be rare.  He states sometimes he feels he needs to drink fluids to help push saliva down as sometimes when he swallows his secretions they can "get caught" in his lower chest and feels fluids help push things through.  This will happen about once per week.  He states his brother had his esophagus stretched for similar symptoms in the past.  He has never had a prior EGD.  He denies any heartburn at baseline.  No reflux symptoms.  No abdominal pains.  No odynophagia.  He denies any problems with his bowels.  No blood in stools.  No family history of esophageal cancer, stomach cancer, or colon cancer.  Denies any blood thinner use.  Denies any problems with anesthesia.  Does not use tobacco.  Last colonoscopy in 2020 without any precancerous polyps.  Colonoscopy 11/21/2018: Dr. Romelle Starcher [facility] - 3mm polyp removed ascending colon, otherwise normal exam. Path shows "benign colonic mucosa" - no adenomatous change  Repeat in 2030  Past Medical History:  Diagnosis Date   Arthritis    BPH (benign prostatic hyperplasia)    Hypertension    Osteoarthritis of elbow    right     Past  Surgical History:  Procedure Laterality Date   TONSILLECTOMY     Family History  Problem Relation Age of Onset   Leukemia Mother    Hypertension Mother    Skin cancer Mother    Prostate cancer Father    Social History   Tobacco Use   Smoking status: Never   Smokeless tobacco: Never  Vaping Use   Vaping Use: Never used  Substance Use Topics   Alcohol use: Yes    Alcohol/week: 4.0 standard drinks of alcohol    Types: 4 Glasses of wine per week    Comment: occ   Drug use: Never   Current Outpatient Medications  Medication Sig Dispense Refill   B Complex Vitamins (VITAMIN B-COMPLEX PO) Take by mouth.     Cholecalciferol (VITAMIN D) 125 MCG (5000 UT) CAPS Take by mouth.     clobetasol cream (TEMOVATE) 0.05 % Apply 1 Application topically 2 (two) times daily. 30 g 0   lisinopril (ZESTRIL) 10 MG tablet Take 1 tablet (10 mg total) by mouth daily. 90 tablet 3   Magnesium 250 MG TABS Take by mouth.     melatonin 5 MG TABS Take 5 mg by mouth.     No current facility-administered medications for this visit.   No Known Allergies   Review of Systems: All systems reviewed and negative  except where noted in HPI.   Lab Results  Component Value Date   WBC 4.5 05/30/2020   HGB 15.0 05/30/2020   HCT 43.6 05/30/2020   MCV 90.3 05/30/2020   PLT 206 05/30/2020    Lab Results  Component Value Date   CREATININE 1.38 (H) 05/30/2020   BUN 19 05/30/2020   NA 143 05/30/2020   K 4.4 05/30/2020   CL 110 05/30/2020   CO2 26 05/30/2020    Lab Results  Component Value Date   ALT 20 05/30/2020   AST 23 05/30/2020   BILITOT 0.7 05/30/2020     Physical Exam: BP 138/80   Pulse 60   Ht 5\' 10"  (1.778 m)   Wt 190 lb (86.2 kg)   SpO2 98%   BMI 27.26 kg/m  Constitutional: Pleasant,well-developed, male in no acute distress. HEENT: Normocephalic and atraumatic. Conjunctivae are normal. No scleral icterus. Neck supple.  Cardiovascular: Normal rate, regular rhythm.  Pulmonary/chest:  Effort normal and breath sounds normal. No wheezing, rales or rhonchi. Abdominal: Soft, nondistended, nontender. There are no masses palpable. No hepatomegaly. Extremities: no edema Lymphadenopathy: No cervical adenopathy noted. Neurological: Alert and oriented to person place and time. Skin: Skin is warm and dry. No rashes noted. Psychiatric: Normal mood and affect. Behavior is normal.   ASSESSMENT: 67 y.o. male here for assessment of the following  1. Dysphagia, unspecified type    Episodic dysphagia, episode of impaction back in 01/12/2023 that passed on its own while waiting in the ER.  He has had some isolated episodes of that historically but more frequently can have sensation of secretions not easily going into his stomach that he drinks fluids to push through.  No reflux symptoms at baseline.  He has never had an EGD.  I reviewed differential diagnosis for the symptoms with him.  Quite possible he has a Schatzki ring/benign stricture but EGD recommended to evaluate, rule out other more concerning pathology, dilate any stricture as amenable.  I discussed risks and benefits of the procedure and anesthesia and he wants to proceed.  Further recommendations pending results.  PLAN: - schedule for EGD at the [facility] - avoid eating large pieces of meat / chicken in the interim to prevent impaction - recall colonoscopy 11/2028  Harlin Rain, MD [facility]  CC: Everrett Coombe, DO

## 2022-07-09 ENCOUNTER — Encounter: Payer: Commercial Managed Care - PPO | Admitting: Gastroenterology

## 2022-07-18 ENCOUNTER — Encounter: Payer: Commercial Managed Care - PPO | Admitting: Gastroenterology

## 2022-07-26 ENCOUNTER — Encounter: Payer: Commercial Managed Care - PPO | Admitting: Gastroenterology

## 2022-07-31 ENCOUNTER — Telehealth: Payer: Self-pay | Admitting: Gastroenterology

## 2022-07-31 NOTE — Telephone Encounter (Signed)
Inbound call from patient wanting to go over the procedure , charges . He want to know how much insurance will cover. Patient stated that he need to hear from someone today otherwise he need to cancel procedure for 6/28.

## 2022-08-03 ENCOUNTER — Encounter: Payer: Commercial Managed Care - PPO | Admitting: Gastroenterology

## 2022-09-20 ENCOUNTER — Other Ambulatory Visit (HOSPITAL_COMMUNITY): Payer: Self-pay

## 2022-09-20 ENCOUNTER — Telehealth: Payer: Self-pay | Admitting: Family Medicine

## 2022-09-20 ENCOUNTER — Other Ambulatory Visit: Payer: Self-pay

## 2022-09-20 DIAGNOSIS — I10 Essential (primary) hypertension: Secondary | ICD-10-CM

## 2022-09-20 MED ORDER — LISINOPRIL 10 MG PO TABS
10.0000 mg | ORAL_TABLET | Freq: Every day | ORAL | 0 refills | Status: DC
  Filled 2022-09-20: qty 30, 30d supply, fill #0

## 2022-09-20 NOTE — Telephone Encounter (Signed)
Please contact the patient to schedule appt with Dr. Ashley Royalty for HTN. Sent 30 day refill. Thanks

## 2022-09-20 NOTE — Telephone Encounter (Signed)
Patient called in for refill on lisinopril (ZESTRIL) 10 MG tablet. Only has 3 pills left. Please advise   [facility]

## 2022-09-21 ENCOUNTER — Other Ambulatory Visit (HOSPITAL_COMMUNITY): Payer: Self-pay

## 2022-10-12 ENCOUNTER — Other Ambulatory Visit: Payer: Self-pay

## 2022-10-15 ENCOUNTER — Encounter: Payer: Commercial Managed Care - PPO | Admitting: Family Medicine

## 2022-10-16 ENCOUNTER — Telehealth: Payer: Self-pay | Admitting: Family Medicine

## 2022-10-16 DIAGNOSIS — Z0000 Encounter for general adult medical examination without abnormal findings: Secondary | ICD-10-CM

## 2022-10-16 DIAGNOSIS — N401 Enlarged prostate with lower urinary tract symptoms: Secondary | ICD-10-CM

## 2022-10-16 DIAGNOSIS — I10 Essential (primary) hypertension: Secondary | ICD-10-CM

## 2022-10-16 NOTE — Telephone Encounter (Signed)
Pt called. Wants to get labs done before his Physical on 9/16 at 9:30 a.m.

## 2022-10-18 NOTE — Telephone Encounter (Signed)
Orders signed.

## 2022-10-22 ENCOUNTER — Ambulatory Visit (INDEPENDENT_AMBULATORY_CARE_PROVIDER_SITE_OTHER): Payer: Commercial Managed Care - PPO | Admitting: Family Medicine

## 2022-10-22 ENCOUNTER — Encounter: Payer: Self-pay | Admitting: Family Medicine

## 2022-10-22 VITALS — BP 124/72 | HR 56 | Ht 70.0 in | Wt 186.0 lb

## 2022-10-22 DIAGNOSIS — N401 Enlarged prostate with lower urinary tract symptoms: Secondary | ICD-10-CM | POA: Diagnosis not present

## 2022-10-22 DIAGNOSIS — Z0000 Encounter for general adult medical examination without abnormal findings: Secondary | ICD-10-CM | POA: Diagnosis not present

## 2022-10-22 DIAGNOSIS — R350 Frequency of micturition: Secondary | ICD-10-CM | POA: Diagnosis not present

## 2022-10-22 DIAGNOSIS — I10 Essential (primary) hypertension: Secondary | ICD-10-CM | POA: Diagnosis not present

## 2022-10-22 MED ORDER — LISINOPRIL 10 MG PO TABS: 10.0000 mg | ORAL_TABLET | Freq: Every day | ORAL | 1 refills | Status: DC

## 2022-10-22 NOTE — Patient Instructions (Signed)
Preventive Care 19 Years and Older, Male Preventive care refers to lifestyle choices and visits with your health care provider that can promote health and wellness. Preventive care visits are also called wellness exams. What can I expect for my preventive care visit? Counseling During your preventive care visit, your health care provider may ask about your: Medical history, including: Past medical problems. Family medical history. History of falls. Current health, including: Emotional well-being. Home life and relationship well-being. Sexual activity. Memory and ability to understand (cognition). Lifestyle, including: Alcohol, nicotine or tobacco, and drug use. Access to firearms. Diet, exercise, and sleep habits. Work and work Astronomer. Sunscreen use. Safety issues such as seatbelt and bike helmet use. Physical exam Your health care provider will check your: Height and weight. These may be used to calculate your BMI (body mass index). BMI is a measurement that tells if you are at a healthy weight. Waist circumference. This measures the distance around your waistline. This measurement also tells if you are at a healthy weight and may help predict your risk of certain diseases, such as type 2 diabetes and high blood pressure. Heart rate and blood pressure. Body temperature. Skin for abnormal spots. What immunizations do I need?  Vaccines are usually given at various ages, according to a schedule. Your health care provider will recommend vaccines for you based on your age, medical history, and lifestyle or other factors, such as travel or where you work. What tests do I need? Screening Your health care provider may recommend screening tests for certain conditions. This may include: Lipid and cholesterol levels. Diabetes screening. This is done by checking your blood sugar (glucose) after you have not eaten for a while (fasting). Hepatitis C test. Hepatitis B test. HIV (human  immunodeficiency virus) test. STI (sexually transmitted infection) testing, if you are at risk. Lung cancer screening. Colorectal cancer screening. Prostate cancer screening. Abdominal aortic aneurysm (AAA) screening. You may need this if you are a current or former smoker. Talk with your health care provider about your test results, treatment options, and if necessary, the need for more tests. Follow these instructions at home: Eating and drinking  Eat a diet that includes fresh fruits and vegetables, whole grains, lean protein, and low-fat dairy products. Limit your intake of foods with high amounts of sugar, saturated fats, and salt. Take vitamin and mineral supplements as recommended by your health care provider. Do not drink alcohol if your health care provider tells you not to drink. If you drink alcohol: Limit how much you have to 0-2 drinks a day. Know how much alcohol is in your drink. In the [place] one drink equals one 12 oz bottle of beer (355 mL), one 5 oz glass of wine (148 mL), or one 1 oz glass of hard liquor (44 mL). Lifestyle Brush your teeth every morning and night with fluoride toothpaste. Floss one time each day. Exercise for at least 30 minutes 5 or more days each week. Do not use any products that contain nicotine or tobacco. These products include cigarettes, chewing tobacco, and vaping devices, such as e-cigarettes. If you need help quitting, ask your health care provider. Do not use drugs. If you are sexually active, practice safe sex. Use a condom or other form of protection to prevent STIs. Take aspirin only as told by your health care provider. Make sure that you understand how much to take and what form to take. Work with your health care provider to find out whether it is safe  and beneficial for you to take aspirin daily. Ask your health care provider if you need to take a cholesterol-lowering medicine (statin). Find healthy ways to manage stress, such  as: Meditation, yoga, or listening to music. Journaling. Talking to a trusted person. Spending time with friends and family. Safety Always wear your seat belt while driving or riding in a vehicle. Do not drive: If you have been drinking alcohol. Do not ride with someone who has been drinking. When you are tired or distracted. While texting. If you have been using any mind-altering substances or drugs. Wear a helmet and other protective equipment during sports activities. If you have firearms in your house, make sure you follow all gun safety procedures. Minimize exposure to UV radiation to reduce your risk of skin cancer. What's next? Visit your health care provider once a year for an annual wellness visit. Ask your health care provider how often you should have your eyes and teeth checked. Stay up to date on all vaccines. This information is not intended to replace advice given to you by your health care provider. Make sure you discuss any questions you have with your health care provider. Document Revised: 07/20/2020 Document Reviewed: 07/20/2020 Elsevier Patient Education  2024 [employer].

## 2022-10-22 NOTE — Assessment & Plan Note (Signed)
Well adult No orders of the defined types were placed in this encounter. Screening:  he will have labs completed today.  Immunizations: UTD Anticipatory guidance/Risk factor reduction:  Recommendations per AVS

## 2022-10-22 NOTE — Progress Notes (Addendum)
John Tran - 67 y.o. male MRN [id number]  Date of birth: 1955-11-03  Subjective Chief Complaint  Patient presents with   Annual Exam    HPI John Tran is a 67 y.o. male here today for annual exam.   He reports that he is doing pretty well.  Doing well with lisinopril at current strength.  BP remains well controlled.    He is moderately active.  Feels that diet is pretty good.   He is a non-smoker.  Occasional EtOH use  Review of Systems  Constitutional:  Negative for chills, fever, malaise/fatigue and weight loss.  HENT:  Negative for congestion, ear pain and sore throat.   Eyes:  Negative for blurred vision, double vision and pain.  Respiratory:  Negative for cough and shortness of breath.   Cardiovascular:  Negative for chest pain and palpitations.  Gastrointestinal:  Negative for abdominal pain, blood in stool, constipation, heartburn and nausea.  Genitourinary:  Negative for dysuria and urgency.  Musculoskeletal:  Negative for joint pain and myalgias.  Neurological:  Negative for dizziness and headaches.  Endo/Heme/Allergies:  Does not bruise/bleed easily.  Psychiatric/Behavioral:  Negative for depression. The patient is not nervous/anxious and does not have insomnia.     No Known Allergies  Past Medical History:  Diagnosis Date   Arthritis    BPH (benign prostatic hyperplasia)    Hypertension    Osteoarthritis of elbow    right    Past Surgical History:  Procedure Laterality Date   TONSILLECTOMY      Social History   Socioeconomic History   Marital status: Married    Spouse name: Not on file   Number of children: 3   Years of education: Not on file   Highest education level: Not on file  Occupational History   Not on file  Tobacco Use   Smoking status: Never   Smokeless tobacco: Never  Vaping Use   Vaping status: Never Used  Substance and Sexual Activity   Alcohol use: Yes    Alcohol/week: 4.0 standard drinks of alcohol    Types: 4 Glasses of  wine per week    Comment: occ   Drug use: Never   Sexual activity: Not on file  Other Topics Concern   Not on file  Social History Narrative   Not on file   Social Determinants of Health   Financial Resource Strain: Low Risk  (09/19/2022)   Received from [facility]   Overall Financial Resource Strain (CARDIA)    Difficulty of Paying Living Expenses: Not hard at all  Food Insecurity: No Food Insecurity (09/19/2022)   Received from [facility]   Hunger Vital Sign    Worried About Running Out of Food in the Last Year: Never true    Ran Out of Food in the Last Year: Never true  Transportation Needs: No Transportation Needs (09/19/2022)   Received from [facility] - Transportation    Lack of Transportation (Medical): No    Lack of Transportation (Non-Medical): No  Physical Activity: Sufficiently Active (09/19/2022)   Received from [facility]   Exercise Vital Sign    Days of Exercise per Week: 5 days    Minutes of Exercise per Session: 150+ min  Stress: No Stress Concern Present (09/19/2022)   Received from [facility] - Occupational Stress Questionnaire    Feeling of Stress : Only a little  Social Connections: Socially Integrated (09/19/2022)  Received from [facility]   Social Network    How would you rate your social network (family, work, friends)?: Good participation with social networks    Family History  Problem Relation Age of Onset   Leukemia Mother    Hypertension Mother    Skin cancer Mother    Prostate cancer Father     Health Maintenance  Topic Date Due   Zoster Vaccines- Shingrix (1 of 2) 01/21/2023 (Originally 06/06/2006)   COVID-19 Vaccine (3 - 2023-24 season) 04/30/2023 (Originally 10/07/2022)   INFLUENZA VACCINE  05/06/2023 (Originally 09/06/2022)   Pneumonia Vaccine 75+ Years old (1 of 1 - PCV) 10/22/2023 (Originally 06/05/2021)   Hepatitis C Screening  10/22/2023 (Originally 06/06/1973)    DTaP/Tdap/Td (2 - Td or Tdap) 09/12/2023   Colonoscopy  11/20/2028   HPV VACCINES  Aged Out     ----------------------------------------------------------------------------------------------------------------------------------------------------------------------------------------------------------------- Physical Exam BP 124/72 (BP Location: Left Arm, Patient Position: Sitting, Cuff Size: Normal)   Pulse (!) 56   Ht 5\' 10"  (1.778 m)   Wt 186 lb (84.4 kg)   SpO2 99%   BMI 26.69 kg/m   Physical Exam Constitutional:      General: He is not in acute distress. HENT:     Head: Normocephalic and atraumatic.     Right Ear: Tympanic membrane and external ear normal.     Left Ear: Tympanic membrane and external ear normal.  Eyes:     General: No scleral icterus. Neck:     Thyroid: No thyromegaly.  Cardiovascular:     Rate and Rhythm: Normal rate and regular rhythm.     Heart sounds: Normal heart sounds.  Pulmonary:     Effort: Pulmonary effort is normal.     Breath sounds: Normal breath sounds.  Abdominal:     General: Bowel sounds are normal. There is no distension.     Palpations: Abdomen is soft.     Tenderness: There is no abdominal tenderness. There is no guarding.  Genitourinary:    Comments: Symmetrically enlarged prostate No discrete nodules.  Musculoskeletal:     Cervical back: Normal range of motion.  Lymphadenopathy:     Cervical: No cervical adenopathy.  Skin:    General: Skin is warm and dry.     Findings: No rash.  Neurological:     Mental Status: He is alert and oriented to person, place, and time.     Cranial Nerves: No cranial nerve deficit.     Motor: No abnormal muscle tone.  Psychiatric:        Mood and Affect: Mood normal.        Behavior: Behavior normal.      ------------------------------------------------------------------------------------------------------------------------------------------------------------------------------------------------------------------- Assessment and Plan  Benign essential hypertension BP is well controlled.  Recommend continuation of lisinopril at current strength.    Well adult exam Well adult No orders of the defined types were placed in this encounter. Screening:  he will have labs completed today.  Immunizations: UTD Anticipatory guidance/Risk factor reduction:  Recommendations per AVS    Meds ordered this encounter  Medications   lisinopril (ZESTRIL) 10 MG tablet    Sig: Take 1 tablet (10 mg total) by mouth daily.    Dispense:  90 tablet    Refill:  1    No follow-ups on file.    This visit occurred during the SARS-CoV-2 public health emergency.  Safety protocols were in place, including screening questions prior to the visit, additional usage of staff PPE, and extensive cleaning of exam room  while observing appropriate contact time as indicated for disinfecting solutions.

## 2022-10-22 NOTE — Assessment & Plan Note (Signed)
BP is well controlled.  Recommend continuation of lisinopril at current strength.

## 2022-10-23 LAB — CBC
Hematocrit: 46.2 % (ref 37.5–51.0)
Hemoglobin: 15.7 g/dL (ref 13.0–17.7)
MCH: 30.9 pg (ref 26.6–33.0)
MCHC: 34 g/dL (ref 31.5–35.7)
MCV: 91 fL (ref 79–97)
Platelets: 214 10*3/uL (ref 150–450)
RBC: 5.08 x10E6/uL (ref 4.14–5.80)
RDW: 12.3 % (ref 11.6–15.4)
WBC: 4 10*3/uL (ref 3.4–10.8)

## 2022-10-23 LAB — CMP14+EGFR
ALT: 25 IU/L (ref 0–44)
AST: 22 IU/L (ref 0–40)
Albumin: 4.4 g/dL (ref 3.9–4.9)
Alkaline Phosphatase: 79 IU/L (ref 44–121)
BUN/Creatinine Ratio: 13 (ref 10–24)
BUN: 17 mg/dL (ref 8–27)
Bilirubin Total: 0.5 mg/dL (ref 0.0–1.2)
CO2: 22 mmol/L (ref 20–29)
Calcium: 9.5 mg/dL (ref 8.6–10.2)
Chloride: 107 mmol/L — ABNORMAL HIGH (ref 96–106)
Creatinine, Ser: 1.28 mg/dL — ABNORMAL HIGH (ref 0.76–1.27)
Globulin, Total: 2 g/dL (ref 1.5–4.5)
Glucose: 102 mg/dL — ABNORMAL HIGH (ref 70–99)
Potassium: 4.3 mmol/L (ref 3.5–5.2)
Sodium: 141 mmol/L (ref 134–144)
Total Protein: 6.4 g/dL (ref 6.0–8.5)
eGFR: 62 mL/min/{1.73_m2} (ref >59)

## 2022-10-23 LAB — TSH: TSH: 1.5 u[IU]/mL (ref 0.450–4.500)

## 2022-10-23 LAB — LIPID PANEL
Chol/HDL Ratio: 2 ratio (ref 0.0–5.0)
Cholesterol, Total: 162 mg/dL (ref 100–199)
HDL: 80 mg/dL (ref >39)
LDL Chol Calc (NIH): 73 mg/dL (ref 0–99)
Triglycerides: 40 mg/dL (ref 0–149)
VLDL Cholesterol Cal: 9 mg/dL (ref 5–40)

## 2022-10-23 LAB — PSA: Prostate Specific Ag, Serum: 2.2 ng/mL (ref 0.0–4.0)

## 2022-10-25 ENCOUNTER — Other Ambulatory Visit (HOSPITAL_COMMUNITY): Payer: Self-pay

## 2022-10-25 ENCOUNTER — Other Ambulatory Visit: Payer: Self-pay | Admitting: Family Medicine

## 2022-10-25 DIAGNOSIS — I10 Essential (primary) hypertension: Secondary | ICD-10-CM

## 2022-10-26 ENCOUNTER — Other Ambulatory Visit (HOSPITAL_COMMUNITY): Payer: Self-pay

## 2022-11-01 DIAGNOSIS — R1310 Dysphagia, unspecified: Secondary | ICD-10-CM | POA: Diagnosis not present

## 2022-12-25 ENCOUNTER — Other Ambulatory Visit (HOSPITAL_COMMUNITY): Payer: Self-pay

## 2022-12-26 ENCOUNTER — Telehealth: Payer: Self-pay | Admitting: Family Medicine

## 2022-12-26 NOTE — Telephone Encounter (Signed)
Prescription Request  12/26/2022  LOV: 10/22/2022  What is the name of the medication or equipment? Lisinopril 10mg   Have you contacted your pharmacy to request a refill?  yes Which pharmacy would you like this sent to?   [facility] [address] [place] [address] Phone: [phone] Fax: [phone]    Patient notified that their request is being sent to the clinical staff for review and that they should receive a response within 2 business days.   Please advise at  [phone]

## 2022-12-27 ENCOUNTER — Other Ambulatory Visit: Payer: Self-pay | Admitting: Family Medicine

## 2022-12-27 DIAGNOSIS — I10 Essential (primary) hypertension: Secondary | ICD-10-CM

## 2022-12-27 MED ORDER — LISINOPRIL 10 MG PO TABS
10.0000 mg | ORAL_TABLET | Freq: Every day | ORAL | 1 refills | Status: DC
  Filled 2022-12-27: qty 90, 90d supply, fill #0
  Filled 2023-03-27: qty 90, 90d supply, fill #1

## 2022-12-28 ENCOUNTER — Other Ambulatory Visit (HOSPITAL_COMMUNITY): Payer: Self-pay

## 2023-03-27 ENCOUNTER — Other Ambulatory Visit: Payer: Self-pay | Admitting: Family Medicine

## 2023-03-27 ENCOUNTER — Other Ambulatory Visit (HOSPITAL_COMMUNITY): Payer: Self-pay

## 2023-03-27 DIAGNOSIS — I10 Essential (primary) hypertension: Secondary | ICD-10-CM

## 2023-03-27 NOTE — Telephone Encounter (Addendum)
Call center agent called to notify the office that patient's prescription refill is at the [facility]. Patient does not want to use this pharmacy. He wants the prescription to be sent to the pharmacy listed below.   [facility], [address] [place] [address] Phone number : [phone]

## 2023-03-27 NOTE — Telephone Encounter (Signed)
Copied from CRM [id number]. Topic: Clinical - Medication Refill >> Mar 27, 2023  9:40 AM Dennison Nancy wrote: Most Recent Primary Care Visit:  Provider: Everrett Coombe  Department: [facility]  Visit Type: PHYSICAL  Date: 10/22/2022  Medication: lisinopril (ZESTRIL) 10 MG tablet  Has the patient contacted their pharmacy? Yes (Agent: If no, request that the patient contact the pharmacy for the refill. If patient does not wish to contact the pharmacy document the reason why and proceed with request.) (Agent: If yes, when and what did the pharmacy advise?)  Is this the correct pharmacy for this prescription? Yes If no, delete pharmacy and type the correct one.  This is the patient's preferred pharmacy:   [facility] , [address] [place] [address] Phone number : [phone]  Has the prescription been filled recently? No  Is the patient out of the medication? No  Has the patient been seen for an appointment in the last year OR does the patient have an upcoming appointment? Yes  Can we respond through MyChart? Yes  Agent: Please be advised that Rx refills may take up to 3 business days. We ask that you follow-up with your pharmacy.

## 2023-03-27 NOTE — Telephone Encounter (Signed)
This RN called pt's pharmacy and the pharmacy confirmed there are refills left on Lisinopril 10mg  for patient to request for refill and pick up. This RN attempted to contact patient. No answer. LVM advising patient of refill status. PHI not revealed in voicemail. Left callback number for any further questions by patient.

## 2023-03-28 MED ORDER — LISINOPRIL 10 MG PO TABS: 10.0000 mg | ORAL_TABLET | Freq: Every day | ORAL | 0 refills | Status: DC

## 2023-06-28 ENCOUNTER — Other Ambulatory Visit: Payer: Self-pay | Admitting: Family Medicine

## 2023-06-28 DIAGNOSIS — I10 Essential (primary) hypertension: Secondary | ICD-10-CM

## 2023-06-28 NOTE — Telephone Encounter (Signed)
 Called patient, patient states that he sees another provider closer to his home, thanks.

## 2023-06-28 NOTE — Telephone Encounter (Signed)
 Pls contact the pt to schedule appt for HTN with Dr. Augustus Ledger. Sending 30 day med refill. Thx

## 2023-06-28 NOTE — Telephone Encounter (Signed)
Called patient left vm to call back and schedule appointment 

## 2023-10-08 ENCOUNTER — Encounter: Payer: Self-pay | Admitting: Sports Medicine
